# Patient Record
Sex: Female | Born: 1960
Health system: Southern US, Community
[De-identification: ages and names within clinical notes are randomized; demographics above are authoritative.]

## PROBLEM LIST (undated history)

## (undated) DIAGNOSIS — G4730 Sleep apnea, unspecified: Secondary | ICD-10-CM

## (undated) DIAGNOSIS — K5190 Ulcerative colitis, unspecified, without complications: Secondary | ICD-10-CM

## (undated) DIAGNOSIS — T7840XA Allergy, unspecified, initial encounter: Secondary | ICD-10-CM

## (undated) DIAGNOSIS — I10 Essential (primary) hypertension: Secondary | ICD-10-CM

## (undated) DIAGNOSIS — C50919 Malignant neoplasm of unspecified site of unspecified female breast: Secondary | ICD-10-CM

## (undated) DIAGNOSIS — D134 Benign neoplasm of liver: Secondary | ICD-10-CM

## (undated) HISTORY — DX: Ulcerative colitis, unspecified, without complications: K51.90

## (undated) HISTORY — DX: Benign neoplasm of liver: D13.4

## (undated) HISTORY — DX: Malignant neoplasm of unspecified site of unspecified female breast: C50.919

## (undated) HISTORY — DX: Essential (primary) hypertension: I10

## (undated) HISTORY — PX: DIAGNOSTIC LAPAROSCOPY: SUR761

---

## 2006-11-12 ENCOUNTER — Ambulatory Visit (HOSPITAL_COMMUNITY): Admission: RE | Admit: 2006-11-12 | Discharge: 2006-11-12 | Payer: Self-pay | Admitting: Cardiology

## 2006-11-21 ENCOUNTER — Encounter: Admission: RE | Admit: 2006-11-21 | Discharge: 2006-11-21 | Payer: Self-pay | Admitting: Gastroenterology

## 2006-11-30 ENCOUNTER — Ambulatory Visit (HOSPITAL_COMMUNITY): Admission: RE | Admit: 2006-11-30 | Discharge: 2006-11-30 | Payer: Self-pay | Admitting: Gastroenterology

## 2006-11-30 ENCOUNTER — Encounter (INDEPENDENT_AMBULATORY_CARE_PROVIDER_SITE_OTHER): Payer: Self-pay | Admitting: Specialist

## 2008-07-14 ENCOUNTER — Other Ambulatory Visit: Admission: RE | Admit: 2008-07-14 | Discharge: 2008-07-14 | Payer: Self-pay | Admitting: Family Medicine

## 2009-07-14 ENCOUNTER — Other Ambulatory Visit: Admission: RE | Admit: 2009-07-14 | Discharge: 2009-07-14 | Payer: Self-pay | Admitting: Family Medicine

## 2012-01-30 ENCOUNTER — Other Ambulatory Visit: Payer: Self-pay | Admitting: Family Medicine

## 2012-01-30 ENCOUNTER — Other Ambulatory Visit (HOSPITAL_COMMUNITY)
Admission: RE | Admit: 2012-01-30 | Discharge: 2012-01-30 | Disposition: A | Discharge status: Home / Self Care | Payer: Commercial Managed Care - PPO | Source: Ambulatory Visit | Attending: Family Medicine | Admitting: Family Medicine

## 2012-01-30 DIAGNOSIS — Z0000 Encounter for general adult medical examination without abnormal findings: Secondary | ICD-10-CM | POA: Insufficient documentation

## 2012-05-20 ENCOUNTER — Other Ambulatory Visit: Payer: Self-pay | Admitting: Gastroenterology

## 2014-07-08 ENCOUNTER — Other Ambulatory Visit: Payer: Self-pay | Admitting: Family Medicine

## 2014-07-08 DIAGNOSIS — M549 Dorsalgia, unspecified: Secondary | ICD-10-CM

## 2014-07-13 ENCOUNTER — Ambulatory Visit
Admission: RE | Admit: 2014-07-13 | Discharge: 2014-07-13 | Disposition: A | Discharge status: Home / Self Care | Payer: Commercial Managed Care - PPO | Source: Ambulatory Visit | Attending: Family Medicine | Admitting: Family Medicine

## 2014-07-13 DIAGNOSIS — M549 Dorsalgia, unspecified: Secondary | ICD-10-CM

## 2015-02-02 ENCOUNTER — Other Ambulatory Visit (HOSPITAL_COMMUNITY)
Admission: RE | Admit: 2015-02-02 | Discharge: 2015-02-02 | Disposition: A | Discharge status: Home / Self Care | Payer: Commercial Managed Care - PPO | Source: Ambulatory Visit | Attending: Family Medicine | Admitting: Family Medicine

## 2015-02-02 ENCOUNTER — Other Ambulatory Visit: Payer: Self-pay | Admitting: Family Medicine

## 2015-02-02 DIAGNOSIS — Z01419 Encounter for gynecological examination (general) (routine) without abnormal findings: Secondary | ICD-10-CM | POA: Insufficient documentation

## 2015-02-03 LAB — CYTOLOGY - PAP

## 2016-08-24 DIAGNOSIS — I10 Essential (primary) hypertension: Secondary | ICD-10-CM | POA: Diagnosis not present

## 2016-09-22 DIAGNOSIS — K633 Ulcer of intestine: Secondary | ICD-10-CM | POA: Diagnosis not present

## 2016-09-22 DIAGNOSIS — K523 Indeterminate colitis: Secondary | ICD-10-CM | POA: Diagnosis not present

## 2016-11-29 ENCOUNTER — Other Ambulatory Visit: Payer: Self-pay | Admitting: Gastroenterology

## 2016-11-29 DIAGNOSIS — D134 Benign neoplasm of liver: Secondary | ICD-10-CM | POA: Diagnosis not present

## 2016-11-29 DIAGNOSIS — K5190 Ulcerative colitis, unspecified, without complications: Secondary | ICD-10-CM | POA: Diagnosis not present

## 2016-12-16 ENCOUNTER — Ambulatory Visit
Admission: RE | Admit: 2016-12-16 | Discharge: 2016-12-16 | Disposition: A | Discharge status: Home / Self Care | Payer: Commercial Managed Care - PPO | Source: Ambulatory Visit | Attending: Gastroenterology | Admitting: Gastroenterology

## 2016-12-16 DIAGNOSIS — D134 Benign neoplasm of liver: Secondary | ICD-10-CM | POA: Diagnosis not present

## 2016-12-16 MED ORDER — GADOBENATE DIMEGLUMINE 529 MG/ML IV SOLN
18.0000 mL | Freq: Once | INTRAVENOUS | Status: AC | PRN
  Administered 2016-12-16: 18 mL via INTRAVENOUS

## 2017-03-20 DIAGNOSIS — I10 Essential (primary) hypertension: Secondary | ICD-10-CM | POA: Diagnosis not present

## 2017-03-27 DIAGNOSIS — L820 Inflamed seborrheic keratosis: Secondary | ICD-10-CM | POA: Diagnosis not present

## 2017-03-27 DIAGNOSIS — L719 Rosacea, unspecified: Secondary | ICD-10-CM | POA: Diagnosis not present

## 2017-09-13 DIAGNOSIS — I10 Essential (primary) hypertension: Secondary | ICD-10-CM | POA: Diagnosis not present

## 2017-09-13 DIAGNOSIS — Z1231 Encounter for screening mammogram for malignant neoplasm of breast: Secondary | ICD-10-CM | POA: Diagnosis not present

## 2017-09-13 DIAGNOSIS — Z803 Family history of malignant neoplasm of breast: Secondary | ICD-10-CM | POA: Diagnosis not present

## 2017-09-25 DIAGNOSIS — L719 Rosacea, unspecified: Secondary | ICD-10-CM | POA: Diagnosis not present

## 2017-09-25 DIAGNOSIS — L814 Other melanin hyperpigmentation: Secondary | ICD-10-CM | POA: Diagnosis not present

## 2017-09-25 DIAGNOSIS — L820 Inflamed seborrheic keratosis: Secondary | ICD-10-CM | POA: Diagnosis not present

## 2018-03-19 DIAGNOSIS — K5190 Ulcerative colitis, unspecified, without complications: Secondary | ICD-10-CM | POA: Diagnosis not present

## 2018-03-19 DIAGNOSIS — I10 Essential (primary) hypertension: Secondary | ICD-10-CM | POA: Diagnosis not present

## 2018-08-28 DIAGNOSIS — Z6830 Body mass index (BMI) 30.0-30.9, adult: Secondary | ICD-10-CM | POA: Diagnosis not present

## 2018-08-28 DIAGNOSIS — Z13220 Encounter for screening for lipoid disorders: Secondary | ICD-10-CM | POA: Diagnosis not present

## 2018-08-28 DIAGNOSIS — I10 Essential (primary) hypertension: Secondary | ICD-10-CM | POA: Diagnosis not present

## 2018-08-28 DIAGNOSIS — Z0000 Encounter for general adult medical examination without abnormal findings: Secondary | ICD-10-CM | POA: Diagnosis not present

## 2018-09-26 DIAGNOSIS — D485 Neoplasm of uncertain behavior of skin: Secondary | ICD-10-CM | POA: Diagnosis not present

## 2018-09-26 DIAGNOSIS — L219 Seborrheic dermatitis, unspecified: Secondary | ICD-10-CM | POA: Diagnosis not present

## 2018-09-26 DIAGNOSIS — L570 Actinic keratosis: Secondary | ICD-10-CM | POA: Diagnosis not present

## 2018-09-26 DIAGNOSIS — L299 Pruritus, unspecified: Secondary | ICD-10-CM | POA: Diagnosis not present

## 2018-10-07 DIAGNOSIS — Z1231 Encounter for screening mammogram for malignant neoplasm of breast: Secondary | ICD-10-CM | POA: Diagnosis not present

## 2018-10-22 DIAGNOSIS — C44719 Basal cell carcinoma of skin of left lower limb, including hip: Secondary | ICD-10-CM | POA: Diagnosis not present

## 2019-09-16 ENCOUNTER — Other Ambulatory Visit: Payer: Self-pay | Admitting: Family Medicine

## 2019-09-16 ENCOUNTER — Other Ambulatory Visit (HOSPITAL_COMMUNITY)
Admission: RE | Admit: 2019-09-16 | Discharge: 2019-09-16 | Disposition: A | Discharge status: Home / Self Care | Payer: Commercial Managed Care - PPO | Source: Ambulatory Visit | Attending: Family Medicine | Admitting: Family Medicine

## 2019-09-16 DIAGNOSIS — Z124 Encounter for screening for malignant neoplasm of cervix: Secondary | ICD-10-CM | POA: Insufficient documentation

## 2019-09-18 LAB — CYTOLOGY - PAP
Comment: NEGATIVE
Diagnosis: NEGATIVE
High risk HPV: NEGATIVE

## 2020-03-15 DIAGNOSIS — J4520 Mild intermittent asthma, uncomplicated: Secondary | ICD-10-CM | POA: Diagnosis not present

## 2020-03-15 DIAGNOSIS — F439 Reaction to severe stress, unspecified: Secondary | ICD-10-CM | POA: Diagnosis not present

## 2020-03-15 DIAGNOSIS — I10 Essential (primary) hypertension: Secondary | ICD-10-CM | POA: Diagnosis not present

## 2020-03-15 DIAGNOSIS — K5190 Ulcerative colitis, unspecified, without complications: Secondary | ICD-10-CM | POA: Diagnosis not present

## 2020-09-23 DIAGNOSIS — Z0000 Encounter for general adult medical examination without abnormal findings: Secondary | ICD-10-CM | POA: Diagnosis not present

## 2020-09-23 DIAGNOSIS — J4520 Mild intermittent asthma, uncomplicated: Secondary | ICD-10-CM | POA: Diagnosis not present

## 2020-09-23 DIAGNOSIS — Z13220 Encounter for screening for lipoid disorders: Secondary | ICD-10-CM | POA: Diagnosis not present

## 2020-09-23 DIAGNOSIS — K5190 Ulcerative colitis, unspecified, without complications: Secondary | ICD-10-CM | POA: Diagnosis not present

## 2020-09-23 DIAGNOSIS — I10 Essential (primary) hypertension: Secondary | ICD-10-CM | POA: Diagnosis not present

## 2020-09-23 DIAGNOSIS — F439 Reaction to severe stress, unspecified: Secondary | ICD-10-CM | POA: Diagnosis not present

## 2020-10-14 DIAGNOSIS — Z1231 Encounter for screening mammogram for malignant neoplasm of breast: Secondary | ICD-10-CM | POA: Diagnosis not present

## 2020-10-26 DIAGNOSIS — R922 Inconclusive mammogram: Secondary | ICD-10-CM | POA: Diagnosis not present

## 2020-10-26 DIAGNOSIS — R921 Mammographic calcification found on diagnostic imaging of breast: Secondary | ICD-10-CM | POA: Diagnosis not present

## 2020-10-26 DIAGNOSIS — R928 Other abnormal and inconclusive findings on diagnostic imaging of breast: Secondary | ICD-10-CM | POA: Diagnosis not present

## 2020-11-08 DIAGNOSIS — D0512 Intraductal carcinoma in situ of left breast: Secondary | ICD-10-CM | POA: Diagnosis not present

## 2020-11-09 ENCOUNTER — Encounter: Payer: Self-pay | Admitting: Family Medicine

## 2020-11-10 ENCOUNTER — Telehealth: Payer: Self-pay | Admitting: Hematology

## 2020-11-10 NOTE — Telephone Encounter (Signed)
LVM in reference to upcoming Breast Clinic appointment for [phone] on [phone], will email patient appointment information as well

## 2020-11-12 ENCOUNTER — Encounter: Payer: Self-pay | Admitting: *Deleted

## 2020-11-12 ENCOUNTER — Telehealth: Payer: Self-pay | Admitting: Hematology

## 2020-11-12 DIAGNOSIS — D0512 Intraductal carcinoma in situ of left breast: Secondary | ICD-10-CM

## 2020-11-12 NOTE — Telephone Encounter (Signed)
Patient called back to switch board to confirm appointment for 4/20

## 2020-11-17 ENCOUNTER — Encounter: Payer: Self-pay | Admitting: Licensed Clinical Social Worker

## 2020-11-17 ENCOUNTER — Ambulatory Visit
Admission: RE | Admit: 2020-11-17 | Discharge: 2020-11-17 | Disposition: A | Discharge status: Home / Self Care | Payer: BC Managed Care – PPO | Source: Ambulatory Visit | Attending: Radiation Oncology | Admitting: Radiation Oncology

## 2020-11-17 ENCOUNTER — Other Ambulatory Visit: Payer: Self-pay | Admitting: General Surgery

## 2020-11-17 ENCOUNTER — Encounter: Payer: Self-pay | Admitting: Hematology

## 2020-11-17 ENCOUNTER — Other Ambulatory Visit: Payer: Self-pay

## 2020-11-17 ENCOUNTER — Inpatient Hospital Stay: Payer: BC Managed Care – PPO

## 2020-11-17 ENCOUNTER — Inpatient Hospital Stay (HOSPITAL_BASED_OUTPATIENT_CLINIC_OR_DEPARTMENT_OTHER): Payer: BC Managed Care – PPO | Admitting: Genetic Counselor

## 2020-11-17 ENCOUNTER — Encounter: Payer: Self-pay | Admitting: *Deleted

## 2020-11-17 ENCOUNTER — Inpatient Hospital Stay: Payer: BC Managed Care – PPO | Attending: Hematology | Admitting: Hematology

## 2020-11-17 VITALS — BP 132/70 | HR 65 | Temp 97.9°F | Resp 20 | Ht 68.0 in | Wt 208.4 lb

## 2020-11-17 DIAGNOSIS — D0512 Intraductal carcinoma in situ of left breast: Secondary | ICD-10-CM

## 2020-11-17 DIAGNOSIS — Z803 Family history of malignant neoplasm of breast: Secondary | ICD-10-CM | POA: Insufficient documentation

## 2020-11-17 DIAGNOSIS — Z79899 Other long term (current) drug therapy: Secondary | ICD-10-CM | POA: Diagnosis not present

## 2020-11-17 DIAGNOSIS — K5190 Ulcerative colitis, unspecified, without complications: Secondary | ICD-10-CM | POA: Insufficient documentation

## 2020-11-17 DIAGNOSIS — I10 Essential (primary) hypertension: Secondary | ICD-10-CM | POA: Insufficient documentation

## 2020-11-17 LAB — CMP (CANCER CENTER ONLY)
ALT: 43 U/L (ref 0–44)
AST: 28 U/L (ref 15–41)
Albumin: 4.3 g/dL (ref 3.5–5.0)
Alkaline Phosphatase: 75 U/L (ref 38–126)
Anion gap: 9 (ref 5–15)
BUN: 13 mg/dL (ref 6–20)
CO2: 30 mmol/L (ref 22–32)
Calcium: 9.3 mg/dL (ref 8.9–10.3)
Chloride: 104 mmol/L (ref 98–111)
Creatinine: 0.78 mg/dL (ref 0.44–1.00)
GFR, Estimated: >60 mL/min (ref >60)
Glucose, Bld: 109 mg/dL — ABNORMAL HIGH (ref 70–99)
Potassium: 4.1 mmol/L (ref 3.5–5.1)
Sodium: 143 mmol/L (ref 135–145)
Total Bilirubin: 1.4 mg/dL — ABNORMAL HIGH (ref 0.3–1.2)
Total Protein: 7.4 g/dL (ref 6.5–8.1)

## 2020-11-17 LAB — CBC WITH DIFFERENTIAL (CANCER CENTER ONLY)
Abs Immature Granulocytes: 0.01 10*3/uL (ref 0.00–0.07)
Basophils Absolute: 0.1 10*3/uL (ref 0.0–0.1)
Basophils Relative: 1 %
Eosinophils Absolute: 0.3 10*3/uL (ref 0.0–0.5)
Eosinophils Relative: 5 %
HCT: 41.3 % (ref 36.0–46.0)
Hemoglobin: 13.9 g/dL (ref 12.0–15.0)
Immature Granulocytes: 0 %
Lymphocytes Relative: 35 %
Lymphs Abs: 1.8 10*3/uL (ref 0.7–4.0)
MCH: 29.7 pg (ref 26.0–34.0)
MCHC: 33.7 g/dL (ref 30.0–36.0)
MCV: 88.2 fL (ref 80.0–100.0)
Monocytes Absolute: 0.5 10*3/uL (ref 0.1–1.0)
Monocytes Relative: 9 %
Neutro Abs: 2.6 10*3/uL (ref 1.7–7.7)
Neutrophils Relative %: 50 %
Platelet Count: 204 10*3/uL (ref 150–400)
RBC: 4.68 MIL/uL (ref 3.87–5.11)
RDW: 13.2 % (ref 11.5–15.5)
WBC Count: 5.2 10*3/uL (ref 4.0–10.5)
nRBC: 0 % (ref 0.0–0.2)

## 2020-11-17 LAB — GENETIC SCREENING ORDER

## 2020-11-17 NOTE — Progress Notes (Signed)
Lori Palmer   Lori Palmer "Lori Palmer" is a 60 y.o. year old female accompanied by patient and husband, Arnell Sieving. Clinical Social Work was referred by [facility] for Palmer of psychosocial needs.   SDOH (Social Determinants of Health) assessments performed: Yes SDOH Interventions   Flowsheet Row Most Recent Value  SDOH Interventions   Food Insecurity Interventions Intervention Not Indicated  Housing Interventions Intervention Not Indicated  Transportation Interventions Intervention Not Indicated      Distress Screen completed: Yes ONCBCN DISTRESS SCREENING 11/17/2020  Screening Type Initial Screening  Distress experienced in past week (1-10) 2  Emotional problem type Nervousness/Anxiety  Information Concerns Type Lack of info about treatment      Family/Social Information:  . Housing Arrangement: patient lives with husband, Arnell Sieving. Daughter and grandchildren live nearby . Family members/support persons in your life? Family and Friends/Colleagues . Transportation concerns: no  . Employment: Working full time as Glass blower/designer for [employer]. Income source: Employment . Financial concerns: No o Type of concern: None . Food access concerns: no . Services Currently in place:  n/a  Coping/ Adjustment to diagnosis: . Patient understands treatment plan and what happens next? yes . Concerns about diagnosis and/or treatment: I'm not especially worried about anything . Patient reported stressors: Nervousness . Patient enjoys time with family/ friends and going to the beach, her job . Current coping skills/ strengths: Capable of independent living, Communication skills, Scientist, research (life sciences), Motivation for treatment/growth and Supportive family/friends    SUMMARY: Current SDOH Barriers:  . No significant SDOH barriers noted today  Interventions: . Discussed common feeling and emotions when being diagnosed with cancer, and the importance of support during  treatment . Informed patient of the support team roles and support services at [facility] . Provided CSW contact information and encouraged patient to call with any questions or concerns   Follow Up Plan: Patient will contact CSW with any support or resource needs Patient verbalizes understanding of plan: Yes    Christeen Douglas , LCSW

## 2020-11-17 NOTE — Progress Notes (Signed)
Radiation Oncology         [phone] ________________________________  Multidisciplinary Breast Oncology Clinic [facility]) Initial Outpatient Consultation  Name: Lori Palmer MRN: [id number]  Date: 11/17/2020  DOB: June 05, 1961  GU:YQIHK, Hal Hope, MD  Rolm Bookbinder, MD   REFERRING PHYSICIAN: Rolm Bookbinder, MD  DIAGNOSIS: The encounter diagnosis was Ductal carcinoma in situ (DCIS) of left breast.  Stage 0 Left Breast ductal carcinoma in situ , ER 0% / PR 0% /, Grade 3    ICD-10-CM   1. Ductal carcinoma in situ (DCIS) of left breast  D05.12     HISTORY OF PRESENT ILLNESS::Lori Palmer is a 60 y.o. female who is presenting to the office today for evaluation of her newly diagnosed breast cancer. She is accompanied by her husband. She is doing well overall.   She had routine screening mammography on 10/14/2020 that revealed an indeterminate mass in the left breast. She underwent unilateral diagnostic mammography with tomography and left breast ultrasonography at [facility] on 10/26/2020 that revealed a 1.0 x 0.9 x 0.7 cm irregular mass in the left breast that was suspicious for malignancy.  Biopsy  showed: High-grade intraductal carcinoma. Prognostic indicators significant for: estrogen receptor, 0% negative and progesterone receptor, 0% negative.  She has a PMHx of HTN and Ulcerative colitis. She is seen by GI and has repeat colonoscopies. She is on medication for her HTN and uses diet control for her colitis. I reviewed her medication list with her. She notes PGM had breast cancer in her 30s.   Socially she is married with 1 adult child. 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    GYN HISTORY  Menarchal: 10 LMP: 2007, s/p endometrial ablation Contraceptive: from age [age] HRT: No G1P: First at age 32  The patient was referred today for presentation in the multidisciplinary conference.  Radiology studies and pathology slides were presented there for review  and discussion of treatment options.  A consensus was discussed regarding potential next steps.  PREVIOUS RADIATION THERAPY: No  PAST MEDICAL HISTORY:  Past Medical History:  Diagnosis Date  . Breast cancer ([place])   . Family history of breast cancer 11/18/2020  . Hepatic adenoma   . Hypertension   . Ulcerative colitis ([place])   . Ulcerative colitis ([place])     PAST SURGICAL HISTORY:No past surgical history on file.  FAMILY HISTORY:  Family History  Problem Relation Age of Onset  . Breast cancer Paternal Grandmother 16    SOCIAL HISTORY:  Social History   Socioeconomic History  . Marital status: Married    Spouse name: Not on file  . Number of children: 1  . Years of education: Not on file  . Highest education level: Not on file  Occupational History  . Occupation: Glass blower/designer  Tobacco Use  . Smoking status: Never Smoker  . Smokeless tobacco: Never Used  Vaping Use  . Vaping Use: Never used  Substance and Sexual Activity  . Alcohol use: Not Currently  . Drug use: Never  . Sexual activity: Not on file  Other Topics Concern  . Not on file  Social History Narrative  . Not on file   Social Determinants of Health   Financial Resource Strain: Not on file  Food Insecurity: No Food Insecurity  . Worried About Charity fundraiser in the Last Year: Never true  . Ran Out of Food in the Last Year: Never true  Transportation Needs: No Transportation Needs  . Lack of Transportation (  Medical): No  . Lack of Transportation (Non-Medical): No  Physical Activity: Not on file  Stress: Not on file  Social Connections: Not on file    ALLERGIES:  Allergies  Allergen Reactions  . Penicillins Hives  . Other Other (See Comments)  . Codeine Hives    MEDICATIONS:  Current Outpatient Medications  Medication Sig Dispense Refill  . albuterol (PROAIR HFA) 108 (90 Base) MCG/ACT inhaler 1 puff as needed    . carvedilol (COREG) 12.5 MG tablet Take 12.5 mg by mouth 2 (two) times  daily.    . citalopram (CELEXA) 10 MG tablet Take 10 mg by mouth daily.    . fluticasone (FLONASE) 50 MCG/ACT nasal spray 1 spray in each nostril    . losartan-hydrochlorothiazide (HYZAAR) 50-12.5 MG tablet Take 1 tablet by mouth daily.     No current facility-administered medications for this encounter.    REVIEW OF SYSTEMS: A 10+ POINT REVIEW OF SYSTEMS WAS OBTAINED including neurology, dermatology, psychiatry, cardiac, respiratory, lymph, extremities, GI, GU, musculoskeletal, constitutional, reproductive, HEENT. On the provided form, she reports no breast pain. She denies nipple discharge or bleeding and any other symptoms.    PHYSICAL EXAM: Vitals:   11/17/20 0830  BP: 132/70  Pulse: 65  Resp: 20  Temp: 97.9 F (36.6 C)  SpO2: 100%    Lungs are clear to auscultation bilaterally. Heart has regular rate and rhythm. No palpable cervical, supraclavicular, or axillary adenopathy. Abdomen soft, non-tender, normal bowel sounds. Right breast with no palpable mass, nipple discharge, or bleeding.  Left breast with bruising associated with biopsy site.  No palpable mass nipple discharge or bleeding.   KPS = 100  100 - Normal; no complaints; no evidence of disease. 90   - Able to carry on normal activity; minor signs or symptoms of disease. 80   - Normal activity with effort; some signs or symptoms of disease. 32   - Cares for self; unable to carry on normal activity or to do active work. 60   - Requires occasional assistance, but is able to care for most of his personal needs. 50   - Requires considerable assistance and frequent medical care. 46   - Disabled; requires special care and assistance. 66   - Severely disabled; hospital admission is indicated although death not imminent. 61   - Very sick; hospital admission necessary; active supportive treatment necessary. 10   - Moribund; fatal processes progressing rapidly. 0     - Dead  Karnofsky DA, Abelmann [place], Craver LS and  Burchenal [facility] [phone]) The use of the nitrogen mustards in the palliative treatment of carcinoma: with particular reference to bronchogenic carcinoma Cancer 1 634-56  LABORATORY DATA:  Lab Results  Component Value Date   WBC 5.2 11/17/2020   HGB 13.9 11/17/2020   HCT 41.3 11/17/2020   MCV 88.2 11/17/2020   PLT 204 11/17/2020   Lab Results  Component Value Date   NA 143 11/17/2020   K 4.1 11/17/2020   CL 104 11/17/2020   CO2 30 11/17/2020   Lab Results  Component Value Date   ALT 43 11/17/2020   AST 28 11/17/2020   ALKPHOS 75 11/17/2020   BILITOT 1.4 (H) 11/17/2020    PULMONARY FUNCTION TEST:   Recent Review Flowsheet Data   There is no flowsheet data to display.     RADIOGRAPHY: No results found.    IMPRESSION: Intraductal carcinoma of the left breast presenting in the 6 o'clock position of the breast.  The tumor is high-grade and ER/PR negative.  The patient will be a good candidate for breast conservation with radiotherapy to the left breast. We discussed the general course of radiation, potential side effects, and toxicities with radiation and the patient is interested in this approach.  Given the high-grade nature of the DCIS and the patient's age I would not recommend lumpectomy alone in this situation  Discussed at conference this morning was a question of microinvasion but given the low risk for nodal metastasis in this situation sentinel node procedure is not indicated.   PLAN:  1. Lumpectomy 2. Adjuvant radiation therapy 3. No significant role for adjuvant hormonal therapy given the negative ER/PR status.   ------------------------------------------------  Blair Promise, PhD, MD  This document serves as a record of services personally performed by Gery Pray, MD. It was created on his behalf by Clerance Lav, a trained medical scribe. The creation of this record is based on the scribe's personal observations and the provider's statements to them. This  document has been checked and approved by the attending provider.

## 2020-11-17 NOTE — Progress Notes (Signed)
[name]   [phone] Fax:([phone]) [place] Note   Patient Care Team: Carol Ada, MD as PCP - General (Family Medicine) Rockwell Germany, RN as Oncology Nurse Navigator Mauro Kaufmann, RN as Oncology Nurse Navigator Rolm Bookbinder, MD as Consulting Physician (General Surgery) Truitt Merle, MD as Consulting Physician (Hematology) Gery Pray, MD as Consulting Physician (Radiation Oncology)  Date of Service:  11/17/2020   CHIEF COMPLAINTS/PURPOSE OF CONSULTATION:  Newly diagnosed Ductal carcinoma in situ (DCIS) of left breast   Oncology History Overview Note  Cancer Staging Ductal carcinoma in situ (DCIS) of left breast Staging form: Breast, AJCC 8th Edition - Clinical stage from 11/05/2020: Stage 0 (cTis (DCIS), cN0, cM0, G3, ER-, PR-, HER2: Not Assessed) - Signed by Truitt Merle, MD on 11/17/2020 Stage prefix: Initial diagnosis Histologic grading system: 3 grade system    Ductal carcinoma in situ (DCIS) of left breast  10/26/2020 Breast US   Impression  The 1x0.9x0.7cm mass in the left breast at 6:00 position middle depth, 5cmfnin the left breast is suspicious for malignancy. Biopsy recommended.     11/05/2020 Cancer Staging   Staging form: Breast, AJCC 8th Edition - Clinical stage from 11/05/2020: Stage 0 (cTis (DCIS), cN0, cM0, G3, ER-, PR-, HER2: Not Assessed) - Signed by Truitt Merle, MD on 11/17/2020 Stage prefix: Initial diagnosis Histologic grading system: 3 grade system   11/08/2020 Initial Biopsy   Diagnosis:  Bresat, left, needle core biopsy, left bresat - 1cm mass at 6:00 position middle depth 3cmfn  -DUCTAL CARCINOMA INSITU, HIGH GRADE, MICROINVASION CANNOT BE EXCLUDED  -SEE COMMENT   ER NEGATIVE PR NEGATIVE    11/12/2020 Initial Diagnosis   Ductal carcinoma in situ (DCIS) of left breast      HISTORY OF PRESENTING ILLNESS:  Lori Palmer 60 y.o. female is a here because of newly diagnosed of left breast  cancer. The patient presents to the clinic today accompanied by her husband.  Her left breast mass was found by screening mammogram. She did not feel her mass herself. She notes she gets yearly mammograms and this is first abnormal mammogram. She denies any breast, nipple, weight or appetite change. She overall has no head, breathing, chest, or abdominal issues. She is overall at baseline.   She has a PMHx of HTN and Ulcerative colitis. She is seen by GI and has repeat colonoscopies. She is on medication for her HTN and uses diet control for her colitis. I reviewed her medication list with her. She notes PGM had breast cancer in her 66s.   Socially she is married with 1 adult child. 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     GYN HISTORY  Menarchal: 10 LMP: 2007, s/p endometrial ablation Contraceptive: from age [age] HRT: No G1P: First at age 23   REVIEW OF SYSTEMS:    Constitutional: Denies fevers, chills or abnormal night sweats Eyes: Denies blurriness of vision, double vision or watery eyes Ears, nose, mouth, throat, and face: Denies mucositis or sore throat Respiratory: Denies cough, dyspnea or wheezes Cardiovascular: Denies palpitation, chest discomfort or lower extremity swelling Gastrointestinal:  Denies nausea, heartburn or change in bowel habits Skin: Denies abnormal skin rashes Lymphatics: Denies new lymphadenopathy or easy bruising Neurological:Denies numbness, tingling or new weaknesses Behavioral/Psych: Mood is stable, no new changes  All other systems were reviewed with the patient and are negative.   MEDICAL HISTORY:  Past Medical History:  Diagnosis Date  . Breast cancer (  [place])   . Hepatic adenoma   . Hypertension   . Ulcerative colitis ([place])   . Ulcerative colitis ([place])     SURGICAL HISTORY: History reviewed. No pertinent surgical history.  SOCIAL HISTORY: Social History   Socioeconomic History  . Marital status: Married    Spouse name: Not on  file  . Number of children: 1  . Years of education: Not on file  . Highest education level: Not on file  Occupational History  . Occupation: Glass blower/designer  Tobacco Use  . Smoking status: Never Smoker  . Smokeless tobacco: Never Used  Vaping Use  . Vaping Use: Never used  Substance and Sexual Activity  . Alcohol use: Not Currently  . Drug use: Never  . Sexual activity: Not on file  Other Topics Concern  . Not on file  Social History Narrative  . Not on file   Social Determinants of Health   Financial Resource Strain: Not on file  Food Insecurity: Not on file  Transportation Needs: Not on file  Physical Activity: Not on file  Stress: Not on file  Social Connections: Not on file  Intimate Partner Violence: Not on file    FAMILY HISTORY: Family History  Problem Relation Age of Onset  . Breast cancer Paternal Grandmother 53    ALLERGIES:  is allergic to penicillins, other, and codeine.  MEDICATIONS:  Current Outpatient Medications  Medication Sig Dispense Refill  . fluticasone (FLONASE) 50 MCG/ACT nasal spray 1 spray in each nostril    . albuterol (PROAIR HFA) 108 (90 Base) MCG/ACT inhaler 1 puff as needed    . carvedilol (COREG) 12.5 MG tablet Take 12.5 mg by mouth 2 (two) times daily.    . citalopram (CELEXA) 10 MG tablet Take 10 mg by mouth daily.    Marland Kitchen losartan-hydrochlorothiazide (HYZAAR) 50-12.5 MG tablet Take 1 tablet by mouth daily.     No current facility-administered medications for this visit.    PHYSICAL EXAMINATION: ECOG PERFORMANCE STATUS: 0 - Asymptomatic  Vitals:   11/17/20 0830  BP: 132/70  Pulse: 65  Resp: 20  Temp: 97.9 F (36.6 C)  SpO2: 100%   Filed Weights   11/17/20 0830  Weight: 208 lb 6.4 oz (94.5 kg)    GENERAL:alert, no distress and comfortable SKIN: skin color, texture, turgor are normal, no rashes or significant lesions EYES: normal, Conjunctiva are pink and non-injected, sclera clear  NECK: supple, thyroid normal size,  non-tender, without nodularity LYMPH:  no palpable lymphadenopathy in the cervical, axillary LUNGS: clear to auscultation and percussion with normal breathing effort HEART: regular rate & rhythm and no murmurs and no lower extremity edema ABDOMEN:abdomen soft, non-tender and normal bowel sounds Musculoskeletal:no cyanosis of digits and no clubbing  NEURO: alert & oriented x 3 with fluent speech, no focal motor/sensory deficits BREAST: (+) Skin ecchymosis of left breast at biopsy site (+) Lumpy breast tissue at left breast biopsy site, likely from bleeding. Right Breast exam benign.  LABORATORY DATA:  I have reviewed the data as listed CBC Latest Ref Rng & Units 11/17/2020  WBC 4.0 - 10.5 K/uL 5.2  Hemoglobin 12.0 - 15.0 g/dL 13.9  Hematocrit 36.0 - 46.0 % 41.3  Platelets 150 - 400 K/uL 204    CMP Latest Ref Rng & Units 11/17/2020  Glucose 70 - 99 mg/dL 109(H)  BUN 6 - 20 mg/dL 13  Creatinine 0.44 - 1.00 mg/dL 0.78  Sodium 135 - 145 mmol/L 143  Potassium 3.5 - 5.1 mmol/L 4.1  Chloride 98 - 111 mmol/L 104  CO2 22 - 32 mmol/L 30  Calcium 8.9 - 10.3 mg/dL 9.3  Total Protein 6.5 - 8.1 g/dL 7.4  Total Bilirubin 0.3 - 1.2 mg/dL 1.4(H)  Alkaline Phos 38 - 126 U/L 75  AST 15 - 41 U/L 28  ALT 0 - 44 U/L 43     RADIOGRAPHIC STUDIES: I have personally reviewed the radiological images as listed and agreed with the findings in the report. No results found.  ASSESSMENT & PLAN:  Lori Palmer is a 60 y.o. Caucasian female with a history of HTN and ulcerative colitis   1. Left breast DCIS, grade III, ER-/PR- -I discussed her breast imaging and needle biopsy results with patient and her family members in great detail. Her left breast mass was found by screening mammogram with 1cm mass. Biopsy showed this to be DCIS with concern for microinvasion.  -She is a candidate for breast conservation surgery. She has been seen by breast surgeon Dr. Donne Hazel, who recommends lumpectomy. -Her DCIS  will be cured by complete surgical resection. Any form of adjuvant therapy is preventive. -Given her negative ER and PR markers, I do not recommend antiestrogen therapy  -She will likely benefit from breast radiation if she undergo lumpectomy to decrease the risk of future breast cancer. She will discuss this further with Dr Sondra Come today.  -We also discussed that biopsy may have sampling limitation, we will review her surgical path, to see if she has any invasive carcinoma components. -We discussed breast cancer surveillance after she completes treatment, Including annual mammogram, breast exam every 6-12 months. Given her high risk for future breast cancer, I recommend annual screening breast MRI also -Her breast exam today was overall benign. Her CBC and CMP from today is WNL except BG 109 and Tbili 1.4.  -She will proceed with surgery and f/u with me as needed in the future.    2. Genetics  -I discussed with only one member of family history of breast cancer, she is not eligible for genetic testing to be covered by insurance. If she wants to proceed with testing, she will have to pay out of pocket. Given her daughter's interested, she wants to proceed with testing. She will be referred to genetics    PLAN:  -Proceed with surgery  -F/u with me as needed in the future. Will see her back if her surgical path shows invasive cancer     No orders of the defined types were placed in this encounter.   All questions were answered. The patient knows to call the clinic with any problems, questions or concerns. The total time spent in the appointment was 40 minutes.     Truitt Merle, MD 11/17/2020 10:46 AM  I, Joslyn Devon, am acting as scribe for Truitt Merle, MD.   I have reviewed the above documentation for accuracy and completeness, and I agree with the above.

## 2020-11-18 ENCOUNTER — Encounter: Payer: Self-pay | Admitting: Genetic Counselor

## 2020-11-18 DIAGNOSIS — Z803 Family history of malignant neoplasm of breast: Secondary | ICD-10-CM | POA: Insufficient documentation

## 2020-11-18 HISTORY — DX: Family history of malignant neoplasm of breast: Z80.3

## 2020-11-18 NOTE — Progress Notes (Signed)
REFERRING PROVIDER: Truitt Merle, MD [place],  [place] [address]  PRIMARY PROVIDER:  Carol Ada, MD  PRIMARY REASON FOR VISIT:  1. Ductal carcinoma in situ (DCIS) of left breast   2. Family history of breast cancer     HISTORY OF PRESENT ILLNESS:   Lori Palmer, a 59 y.o. female, was seen for a [facility] cancer genetics consultation at the request of Dr. Burr Medico during the breast multidisciplinary clinic due to a personal and family history of cancer.  Lori Palmer presents to clinic today to discuss the possibility of a hereditary predisposition to cancer, to discuss genetic testing, and to further clarify her future cancer risks, as well as potential cancer risks for family members.   In April 2022, at the age of 47, Lori Palmer was diagnosed with ductal carcinoma in situ of the left breast. The preliminary treatment plan includes surgery and adjuvant radiation.   CANCER HISTORY:  Oncology History Overview Note  Cancer Staging Ductal carcinoma in situ (DCIS) of left breast Staging form: Breast, AJCC 8th Edition - Clinical stage from 11/05/2020: Stage 0 (cTis (DCIS), cN0, cM0, G3, ER-, PR-, HER2: Not Assessed) - Signed by Truitt Merle, MD on 11/17/2020 Stage prefix: Initial diagnosis Histologic grading system: 3 grade system    Ductal carcinoma in situ (DCIS) of left breast  10/26/2020 Breast US   Impression  The 1x0.9x0.7cm mass in the left breast at 6:00 position middle depth, 5cmfnin the left breast is suspicious for malignancy. Biopsy recommended.     11/05/2020 Cancer Staging   Staging form: Breast, AJCC 8th Edition - Clinical stage from 11/05/2020: Stage 0 (cTis (DCIS), cN0, cM0, G3, ER-, PR-, HER2: Not Assessed) - Signed by Truitt Merle, MD on 11/17/2020 Stage prefix: Initial diagnosis Histologic grading system: 3 grade system   11/08/2020 Initial Biopsy   Diagnosis:  Bresat, left, needle core biopsy, left bresat - 1cm mass at 6:00 position middle depth 3cmfn   -DUCTAL CARCINOMA INSITU, HIGH GRADE, MICROINVASION CANNOT BE EXCLUDED  -SEE COMMENT   ER NEGATIVE PR NEGATIVE    11/12/2020 Initial Diagnosis   Ductal carcinoma in situ (DCIS) of left breast     RISK FACTORS:  Menarche was at age 26.  First live birth at age 89.  OCP use for approximately 22 years.  Ovaries intact: yes.  Hysterectomy: no.  Menopausal status: postmenopausal.  HRT use: 0 years. Colonoscopy: yes; most recent in May 2021. Mammogram within the last year: yes. Up to date with pelvic exams: yes; most recent PAP in 2021  Past Medical History:  Diagnosis Date  . Breast cancer ([place])   . Family history of breast cancer 11/18/2020  . Hepatic adenoma   . Hypertension   . Ulcerative colitis ([place])   . Ulcerative colitis ([place])     No past surgical history on file.  Social History   Socioeconomic History  . Marital status: Married    Spouse name: Not on file  . Number of children: 1  . Years of education: Not on file  . Highest education level: Not on file  Occupational History  . Occupation: Glass blower/designer  Tobacco Use  . Smoking status: Never Smoker  . Smokeless tobacco: Never Used  Vaping Use  . Vaping Use: Never used  Substance and Sexual Activity  . Alcohol use: Not Currently  . Drug use: Never  . Sexual activity: Not on file  Other Topics Concern  . Not on file  Social History Narrative  .  Not on file   Social Determinants of Health   Financial Resource Strain: Not on file  Food Insecurity: No Food Insecurity  . Worried About Charity fundraiser in the Last Year: Never true  . Ran Out of Food in the Last Year: Never true  Transportation Needs: No Transportation Needs  . Lack of Transportation (Medical): No  . Lack of Transportation (Non-Medical): No  Physical Activity: Not on file  Stress: Not on file  Social Connections: Not on file     FAMILY HISTORY:  We obtained a detailed, 4-generation family history.  Significant diagnoses are  listed below: Family History  Problem Relation Age of Onset  . Breast cancer Paternal Grandmother 86    Lori Palmer has one daughter, Lori Palmer, who is 76 years old.  She has one brother, age 33, and one sister, age 24.  Lori Palmer mother passed away at age 72 and did not have cancer.  Lori Palmer father passed away at age 61 and did not have cancer.  Lori Palmer paternal grandmother was diagnosed with breast cancer at age 58s.    Lori Palmer is is no of previous family history of genetic testing for hereditary cancer risks. There is no reported Ashkenazi Jewish ancestry. There is no known consanguinity.  GENETIC COUNSELING ASSESSMENT: Lori Palmer is a 60 y.o. female with a personal and family history of cancer which is not suggestive of a hereditary cancer syndrome at this time. We, therefore, discussed and recommended the following at today's visit.   DISCUSSION: We discussed that, in general, most cancer is not inherited in families, but instead is sporadic or familial. Sporadic cancers occur by chance and typically happen at older ages (>50 years) as this type of cancer is caused by genetic changes acquired during an individual's lifetime. Some families have more cancers than would be expected by chance; however, the ages or types of cancer are not consistent with a known genetic mutation or known genetic mutations have been ruled out. This type of familial cancer is thought to be due to a combination of multiple genetic, environmental, hormonal, and lifestyle factors. While this combination of factors likely increases the risk of cancer, the exact source of this risk is not currently identifiable or testable.    We discussed that approximately 5-10% of cancer is hereditary, meaning that it is due to a mutation in a single gene that is passed down from generation to generation in a family. Most hereditary cases of breast cancer are associated with mutations in BRCA1/2. There are other  genes that can be associated an increased risk for breast cancer. We discussed that testing can be beneficial for several reasons, including knowing about other cancer risks, identifying potential screening and risk-reduction options that may be appropriate, and to understand if other family members could be at risk for cancer and allow them to undergo genetic testing.   We discussed with Lori Palmer that the personal and family history does not meet insurance or NCCN criteria for genetic testing and, therefore, is not highly consistent with a familial hereditary cancer syndrome.  We feel she is at low risk to harbor a gene mutation associated with such a condition. Thus, we did not recommend any genetic testing, at this time, and recommended Lori Palmer continue to follow the cancer screening guidelines given by her primary healthcare provider.  Lori Palmer was made aware that testing is available if she wishes to proceed with genetic testing through a self-pay price of $  100.     PLAN:  Lori Palmer did not wish to pursue genetic testing at today's visit.  She stated that her daughter, who is more interested in genetic testing, may coordinate testing through her OB/GYN. We understand this decision and remain available to coordinate genetic testing at any time as needed in the future. We, therefore, recommend Lori Palmer continue to follow the cancer screening guidelines given by her primary healthcare and oncology providers.  Lastly, we encouraged Lori Palmer to remain in contact with cancer genetics annually so that we can continuously update the family history and inform her of any changes in cancer genetics and testing that may be of benefit for this family.   Lori Palmer questions were answered to her satisfaction today. Our contact information was provided should additional questions or concerns arise. Thank you for the referral and allowing [place] to share in the care of your patient.   Lori Palmer M.  Joette Catching, [place], [facility] Genetic Counselor [email or web address] (P) [phone]  The patient was seen for a total of 20 minutes in face-to-face genetic counseling.  Drs. Magrinat, Lindi Adie and/or Burr Medico were available to discuss this case as needed.    _______________________________________________________________________ For Office Staff:  Number of people involved in session: 1 Was an Intern/ student involved with case: no

## 2020-11-19 ENCOUNTER — Ambulatory Visit
Admission: RE | Admit: 2020-11-19 | Discharge: 2020-11-19 | Disposition: A | Discharge status: Home / Self Care | Payer: Self-pay | Source: Ambulatory Visit | Attending: Radiation Oncology | Admitting: Radiation Oncology

## 2020-11-19 ENCOUNTER — Other Ambulatory Visit: Payer: Self-pay | Admitting: Radiation Oncology

## 2020-11-19 ENCOUNTER — Encounter: Payer: Self-pay | Admitting: *Deleted

## 2020-11-19 ENCOUNTER — Inpatient Hospital Stay
Admission: RE | Admit: 2020-11-19 | Discharge: 2020-11-19 | Disposition: A | Discharge status: Home / Self Care | Payer: Self-pay | Source: Ambulatory Visit | Attending: Radiation Oncology | Admitting: Radiation Oncology

## 2020-11-19 DIAGNOSIS — C50919 Malignant neoplasm of unspecified site of unspecified female breast: Secondary | ICD-10-CM

## 2020-11-19 DIAGNOSIS — D0512 Intraductal carcinoma in situ of left breast: Secondary | ICD-10-CM

## 2020-11-22 ENCOUNTER — Telehealth: Payer: Self-pay | Admitting: *Deleted

## 2020-11-22 ENCOUNTER — Encounter: Payer: Self-pay | Admitting: *Deleted

## 2020-11-22 NOTE — Telephone Encounter (Signed)
Spoke to pt concerning [facility] from 4.20.22. Denies questions or concerns regarding dx or treatment care plan. Encourage pt to call with needs. Received verbal understanding. 

## 2020-11-26 ENCOUNTER — Other Ambulatory Visit: Payer: Self-pay

## 2020-11-26 ENCOUNTER — Encounter (HOSPITAL_BASED_OUTPATIENT_CLINIC_OR_DEPARTMENT_OTHER): Payer: Self-pay | Admitting: General Surgery

## 2020-11-26 NOTE — Progress Notes (Signed)
Spoke with patient for PAT phone call. She mentioned that at her biopsy appointment she had some abnormal bleeding afterwards where the nurses had to hold extra pressure, but was discharged home safely. Patient was instructed to not take any supplements, NSAIDs, or other blood thinning medications before surgery. Left message with a nurse at [facility] to notify Dr. Donne Hazel.

## 2020-11-29 ENCOUNTER — Other Ambulatory Visit (HOSPITAL_COMMUNITY)
Admission: RE | Admit: 2020-11-29 | Discharge: 2020-11-29 | Disposition: A | Discharge status: Home / Self Care | Payer: BC Managed Care – PPO | Source: Ambulatory Visit | Attending: General Surgery | Admitting: General Surgery

## 2020-11-29 DIAGNOSIS — Z20822 Contact with and (suspected) exposure to covid-19: Secondary | ICD-10-CM | POA: Insufficient documentation

## 2020-11-29 DIAGNOSIS — D0512 Intraductal carcinoma in situ of left breast: Secondary | ICD-10-CM | POA: Diagnosis not present

## 2020-11-29 DIAGNOSIS — N6012 Diffuse cystic mastopathy of left breast: Secondary | ICD-10-CM | POA: Diagnosis not present

## 2020-11-29 DIAGNOSIS — Z01812 Encounter for preprocedural laboratory examination: Secondary | ICD-10-CM | POA: Insufficient documentation

## 2020-11-29 DIAGNOSIS — D242 Benign neoplasm of left breast: Secondary | ICD-10-CM | POA: Diagnosis not present

## 2020-11-30 ENCOUNTER — Other Ambulatory Visit: Payer: Self-pay | Admitting: *Deleted

## 2020-11-30 LAB — SARS CORONAVIRUS 2 (TAT 6-24 HRS): SARS Coronavirus 2: NEGATIVE

## 2020-12-01 ENCOUNTER — Encounter (HOSPITAL_BASED_OUTPATIENT_CLINIC_OR_DEPARTMENT_OTHER)
Admission: RE | Admit: 2020-12-01 | Discharge: 2020-12-01 | Disposition: A | Discharge status: Home / Self Care | Payer: BC Managed Care – PPO | Source: Ambulatory Visit | Attending: General Surgery | Admitting: General Surgery

## 2020-12-01 DIAGNOSIS — Z20822 Contact with and (suspected) exposure to covid-19: Secondary | ICD-10-CM | POA: Diagnosis not present

## 2020-12-01 DIAGNOSIS — D242 Benign neoplasm of left breast: Secondary | ICD-10-CM | POA: Diagnosis not present

## 2020-12-01 DIAGNOSIS — Z01818 Encounter for other preprocedural examination: Secondary | ICD-10-CM | POA: Insufficient documentation

## 2020-12-01 DIAGNOSIS — N6012 Diffuse cystic mastopathy of left breast: Secondary | ICD-10-CM | POA: Diagnosis not present

## 2020-12-01 DIAGNOSIS — D0512 Intraductal carcinoma in situ of left breast: Secondary | ICD-10-CM | POA: Diagnosis not present

## 2020-12-01 LAB — BASIC METABOLIC PANEL
Anion gap: 7 (ref 5–15)
BUN: 15 mg/dL (ref 6–20)
CO2: 30 mmol/L (ref 22–32)
Calcium: 9.4 mg/dL (ref 8.9–10.3)
Chloride: 101 mmol/L (ref 98–111)
Creatinine, Ser: 0.76 mg/dL (ref 0.44–1.00)
GFR, Estimated: >60 mL/min (ref >60)
Glucose, Bld: 101 mg/dL — ABNORMAL HIGH (ref 70–99)
Potassium: 4.3 mmol/L (ref 3.5–5.1)
Sodium: 138 mmol/L (ref 135–145)

## 2020-12-01 MED ORDER — ENSURE PRE-SURGERY PO LIQD: 296.0000 mL | Freq: Once | ORAL | Status: DC

## 2020-12-01 NOTE — Progress Notes (Signed)
Sent message reminding patient to come in for lab work today.

## 2020-12-01 NOTE — Progress Notes (Signed)

## 2020-12-02 ENCOUNTER — Ambulatory Visit (HOSPITAL_BASED_OUTPATIENT_CLINIC_OR_DEPARTMENT_OTHER)
Admission: RE | Admit: 2020-12-02 | Discharge: 2020-12-02 | Disposition: A | Discharge status: Home / Self Care | Payer: BC Managed Care – PPO | Attending: General Surgery | Admitting: General Surgery

## 2020-12-02 ENCOUNTER — Encounter (HOSPITAL_BASED_OUTPATIENT_CLINIC_OR_DEPARTMENT_OTHER): Payer: Self-pay | Admitting: General Surgery

## 2020-12-02 ENCOUNTER — Ambulatory Visit (HOSPITAL_BASED_OUTPATIENT_CLINIC_OR_DEPARTMENT_OTHER): Payer: BC Managed Care – PPO | Admitting: Anesthesiology

## 2020-12-02 ENCOUNTER — Encounter (HOSPITAL_BASED_OUTPATIENT_CLINIC_OR_DEPARTMENT_OTHER)
Admission: RE | Disposition: A | Discharge status: Home / Self Care | Payer: Self-pay | Source: Home / Self Care | Attending: General Surgery

## 2020-12-02 ENCOUNTER — Other Ambulatory Visit: Payer: Self-pay

## 2020-12-02 DIAGNOSIS — D242 Benign neoplasm of left breast: Secondary | ICD-10-CM | POA: Insufficient documentation

## 2020-12-02 DIAGNOSIS — D0512 Intraductal carcinoma in situ of left breast: Secondary | ICD-10-CM | POA: Diagnosis not present

## 2020-12-02 DIAGNOSIS — Z20822 Contact with and (suspected) exposure to covid-19: Secondary | ICD-10-CM | POA: Insufficient documentation

## 2020-12-02 DIAGNOSIS — C50912 Malignant neoplasm of unspecified site of left female breast: Secondary | ICD-10-CM | POA: Diagnosis not present

## 2020-12-02 DIAGNOSIS — K5190 Ulcerative colitis, unspecified, without complications: Secondary | ICD-10-CM | POA: Diagnosis not present

## 2020-12-02 DIAGNOSIS — N6012 Diffuse cystic mastopathy of left breast: Secondary | ICD-10-CM | POA: Insufficient documentation

## 2020-12-02 DIAGNOSIS — N6092 Unspecified benign mammary dysplasia of left breast: Secondary | ICD-10-CM | POA: Diagnosis not present

## 2020-12-02 DIAGNOSIS — G4730 Sleep apnea, unspecified: Secondary | ICD-10-CM | POA: Diagnosis not present

## 2020-12-02 DIAGNOSIS — I10 Essential (primary) hypertension: Secondary | ICD-10-CM | POA: Diagnosis not present

## 2020-12-02 HISTORY — DX: Sleep apnea, unspecified: G47.30

## 2020-12-02 HISTORY — PX: BREAST LUMPECTOMY WITH RADIOACTIVE SEED LOCALIZATION: SHX6424

## 2020-12-02 HISTORY — DX: Allergy, unspecified, initial encounter: T78.40XA

## 2020-12-02 SURGERY — BREAST LUMPECTOMY WITH RADIOACTIVE SEED LOCALIZATION
Anesthesia: General | Site: Breast | Laterality: Left

## 2020-12-02 MED ORDER — EPHEDRINE 5 MG/ML INJ
INTRAVENOUS | Status: AC
  Filled 2020-12-02: qty 10

## 2020-12-02 MED ORDER — DEXAMETHASONE SODIUM PHOSPHATE 10 MG/ML IJ SOLN
INTRAMUSCULAR | Status: AC
  Filled 2020-12-02: qty 1

## 2020-12-02 MED ORDER — OXYCODONE HCL 5 MG/5ML PO SOLN
5.0000 mg | Freq: Once | ORAL | Status: DC | PRN
Start: 2020-12-02 — End: 2020-12-02

## 2020-12-02 MED ORDER — MIDAZOLAM HCL 5 MG/5ML IJ SOLN
INTRAMUSCULAR | Status: DC | PRN
  Administered 2020-12-02: 2 mg via INTRAVENOUS

## 2020-12-02 MED ORDER — PROPOFOL 10 MG/ML IV BOLUS
INTRAVENOUS | Status: DC | PRN
  Administered 2020-12-02: 200 mg via INTRAVENOUS

## 2020-12-02 MED ORDER — PROMETHAZINE HCL 25 MG/ML IJ SOLN: 6.2500 mg | INTRAMUSCULAR | Status: DC | PRN

## 2020-12-02 MED ORDER — HYDROMORPHONE HCL 1 MG/ML IJ SOLN: 0.2500 mg | INTRAMUSCULAR | Status: DC | PRN

## 2020-12-02 MED ORDER — AMISULPRIDE (ANTIEMETIC) 5 MG/2ML IV SOLN: 10.0000 mg | Freq: Once | INTRAVENOUS | Status: DC | PRN

## 2020-12-02 MED ORDER — BUPIVACAINE HCL (PF) 0.25 % IJ SOLN
INTRAMUSCULAR | Status: DC | PRN
  Administered 2020-12-02: 10 mL

## 2020-12-02 MED ORDER — ONDANSETRON HCL 4 MG/2ML IJ SOLN
INTRAMUSCULAR | Status: AC
  Filled 2020-12-02: qty 2

## 2020-12-02 MED ORDER — KETOROLAC TROMETHAMINE 15 MG/ML IJ SOLN
15.0000 mg | INTRAMUSCULAR | Status: AC
  Administered 2020-12-02: 15 mg via INTRAVENOUS

## 2020-12-02 MED ORDER — MIDAZOLAM HCL 2 MG/2ML IJ SOLN
INTRAMUSCULAR | Status: AC
  Filled 2020-12-02: qty 2

## 2020-12-02 MED ORDER — EPHEDRINE SULFATE 50 MG/ML IJ SOLN
INTRAMUSCULAR | Status: DC | PRN
  Administered 2020-12-02: 20 mg via INTRAVENOUS

## 2020-12-02 MED ORDER — FENTANYL CITRATE (PF) 100 MCG/2ML IJ SOLN
INTRAMUSCULAR | Status: AC
  Filled 2020-12-02: qty 2

## 2020-12-02 MED ORDER — LIDOCAINE HCL (CARDIAC) PF 100 MG/5ML IV SOSY
PREFILLED_SYRINGE | INTRAVENOUS | Status: DC | PRN
  Administered 2020-12-02: 60 mg via INTRAVENOUS

## 2020-12-02 MED ORDER — PHENYLEPHRINE 40 MCG/ML (10ML) SYRINGE FOR IV PUSH (FOR BLOOD PRESSURE SUPPORT)
PREFILLED_SYRINGE | INTRAVENOUS | Status: AC
  Filled 2020-12-02: qty 10

## 2020-12-02 MED ORDER — LACTATED RINGERS IV SOLN: INTRAVENOUS | Status: DC

## 2020-12-02 MED ORDER — FENTANYL CITRATE (PF) 100 MCG/2ML IJ SOLN
INTRAMUSCULAR | Status: DC | PRN
  Administered 2020-12-02: 100 ug via INTRAVENOUS

## 2020-12-02 MED ORDER — TRAMADOL HCL 50 MG PO TABS: 50.0000 mg | ORAL_TABLET | Freq: Four times a day (QID) | ORAL | 0 refills | Status: AC | PRN

## 2020-12-02 MED ORDER — KETOROLAC TROMETHAMINE 15 MG/ML IJ SOLN
INTRAMUSCULAR | Status: AC
  Filled 2020-12-02: qty 1

## 2020-12-02 MED ORDER — DEXAMETHASONE SODIUM PHOSPHATE 4 MG/ML IJ SOLN
INTRAMUSCULAR | Status: DC | PRN
  Administered 2020-12-02: 10 mg via INTRAVENOUS

## 2020-12-02 MED ORDER — MEPERIDINE HCL 25 MG/ML IJ SOLN: 6.2500 mg | INTRAMUSCULAR | Status: DC | PRN

## 2020-12-02 MED ORDER — ONDANSETRON HCL 4 MG/2ML IJ SOLN
INTRAMUSCULAR | Status: DC | PRN
  Administered 2020-12-02: 4 mg via INTRAVENOUS

## 2020-12-02 MED ORDER — PROPOFOL 500 MG/50ML IV EMUL
INTRAVENOUS | Status: AC
  Filled 2020-12-02: qty 50

## 2020-12-02 MED ORDER — OXYCODONE HCL 5 MG PO TABS: 5.0000 mg | ORAL_TABLET | Freq: Once | ORAL | Status: DC | PRN

## 2020-12-02 MED ORDER — CEFAZOLIN SODIUM-DEXTROSE 2-4 GM/100ML-% IV SOLN
2.0000 g | INTRAVENOUS | Status: AC
  Administered 2020-12-02: 2 g via INTRAVENOUS

## 2020-12-02 MED ORDER — CEFAZOLIN SODIUM-DEXTROSE 2-4 GM/100ML-% IV SOLN
INTRAVENOUS | Status: AC
  Filled 2020-12-02: qty 100

## 2020-12-02 MED ORDER — ACETAMINOPHEN 500 MG PO TABS
1000.0000 mg | ORAL_TABLET | ORAL | Status: AC
  Administered 2020-12-02: 1000 mg via ORAL

## 2020-12-02 MED ORDER — ACETAMINOPHEN 500 MG PO TABS
ORAL_TABLET | ORAL | Status: AC
  Filled 2020-12-02: qty 2

## 2020-12-02 MED ORDER — LIDOCAINE 2% (20 MG/ML) 5 ML SYRINGE
INTRAMUSCULAR | Status: AC
  Filled 2020-12-02: qty 5

## 2020-12-02 SURGICAL SUPPLY — 57 items
ADH SKN CLS APL DERMABOND .7 (GAUZE/BANDAGES/DRESSINGS) ×1
APL PRP STRL LF DISP 70% ISPRP (MISCELLANEOUS) ×1
APPLIER CLIP 9.375 MED OPEN (MISCELLANEOUS)
APR CLP MED 9.3 20 MLT OPN (MISCELLANEOUS)
BINDER BREAST LRG (GAUZE/BANDAGES/DRESSINGS) IMPLANT
BINDER BREAST MEDIUM (GAUZE/BANDAGES/DRESSINGS) IMPLANT
BINDER BREAST XLRG (GAUZE/BANDAGES/DRESSINGS) ×1 IMPLANT
BINDER BREAST XXLRG (GAUZE/BANDAGES/DRESSINGS) IMPLANT
BLADE SURG 15 STRL LF DISP TIS (BLADE) ×1 IMPLANT
BLADE SURG 15 STRL SS (BLADE) ×2
CANISTER SUC SOCK COL 7IN (MISCELLANEOUS) IMPLANT
CANISTER SUCT 1200ML W/VALVE (MISCELLANEOUS) ×1 IMPLANT
CHLORAPREP W/TINT 26 (MISCELLANEOUS) ×2 IMPLANT
CLIP APPLIE 9.375 MED OPEN (MISCELLANEOUS) IMPLANT
CLIP VESOCCLUDE SM WIDE 6/CT (CLIP) IMPLANT
COVER BACK TABLE 60X90IN (DRAPES) ×2 IMPLANT
COVER MAYO STAND STRL (DRAPES) ×2 IMPLANT
COVER PROBE W GEL 5X96 (DRAPES) ×2 IMPLANT
COVER WAND RF STERILE (DRAPES) IMPLANT
DECANTER SPIKE VIAL GLASS SM (MISCELLANEOUS) ×1 IMPLANT
DERMABOND ADVANCED (GAUZE/BANDAGES/DRESSINGS) ×1
DERMABOND ADVANCED .7 DNX12 (GAUZE/BANDAGES/DRESSINGS) ×1 IMPLANT
DRAPE LAPAROSCOPIC ABDOMINAL (DRAPES) ×2 IMPLANT
DRAPE UTILITY XL STRL (DRAPES) ×2 IMPLANT
DRSG TEGADERM 4X4.75 (GAUZE/BANDAGES/DRESSINGS) IMPLANT
ELECT COATED BLADE 2.86 ST (ELECTRODE) ×1 IMPLANT
ELECT REM PT RETURN 9FT ADLT (ELECTROSURGICAL) ×2
ELECTRODE REM PT RTRN 9FT ADLT (ELECTROSURGICAL) ×1 IMPLANT
GAUZE SPONGE 4X4 12PLY STRL LF (GAUZE/BANDAGES/DRESSINGS) IMPLANT
GLOVE SURG ENC MOIS LTX SZ7 (GLOVE) ×4 IMPLANT
GLOVE SURG UNDER POLY LF SZ7.5 (GLOVE) ×2 IMPLANT
GOWN STRL REUS W/ TWL LRG LVL3 (GOWN DISPOSABLE) ×2 IMPLANT
GOWN STRL REUS W/TWL LRG LVL3 (GOWN DISPOSABLE) ×4
HEMOSTAT ARISTA ABSORB 3G PWDR (HEMOSTASIS) IMPLANT
KIT MARKER MARGIN INK (KITS) ×2 IMPLANT
NDL HYPO 25X1 1.5 SAFETY (NEEDLE) ×1 IMPLANT
NEEDLE HYPO 25X1 1.5 SAFETY (NEEDLE) ×2 IMPLANT
NS IRRIG 1000ML POUR BTL (IV SOLUTION) ×1 IMPLANT
PACK BASIN DAY SURGERY FS (CUSTOM PROCEDURE TRAY) ×2 IMPLANT
PENCIL SMOKE EVACUATOR (MISCELLANEOUS) ×2 IMPLANT
RETRACTOR ONETRAX LX 90X20 (MISCELLANEOUS) ×1 IMPLANT
SLEEVE SCD COMPRESS KNEE MED (STOCKING) ×2 IMPLANT
SPONGE LAP 4X18 RFD (DISPOSABLE) ×2 IMPLANT
STRIP CLOSURE SKIN 1/2X4 (GAUZE/BANDAGES/DRESSINGS) ×2 IMPLANT
SUT MNCRL AB 4-0 PS2 18 (SUTURE) ×2 IMPLANT
SUT MON AB 5-0 PS2 18 (SUTURE) IMPLANT
SUT SILK 2 0 SH (SUTURE) IMPLANT
SUT VIC AB 2-0 SH 27 (SUTURE) ×4
SUT VIC AB 2-0 SH 27XBRD (SUTURE) ×1 IMPLANT
SUT VIC AB 3-0 SH 27 (SUTURE) ×2
SUT VIC AB 3-0 SH 27X BRD (SUTURE) ×1 IMPLANT
SUT VIC AB 5-0 PS2 18 (SUTURE) IMPLANT
SYR CONTROL 10ML LL (SYRINGE) ×2 IMPLANT
TOWEL GREEN STERILE FF (TOWEL DISPOSABLE) ×2 IMPLANT
TRAY FAXITRON CT DISP (TRAY / TRAY PROCEDURE) ×2 IMPLANT
TUBE CONNECTING 20X1/4 (TUBING) ×2 IMPLANT
YANKAUER SUCT BULB TIP NO VENT (SUCTIONS) ×1 IMPLANT

## 2020-12-02 NOTE — Anesthesia Procedure Notes (Signed)
Procedure Name: LMA Insertion Performed by: Rustin Erhart, Collin, CRNA Pre-anesthesia Checklist: Patient identified, Emergency Drugs available, Suction available and Patient being monitored Patient Re-evaluated:Patient Re-evaluated prior to induction Oxygen Delivery Method: Circle system utilized Preoxygenation: Pre-oxygenation with 100% oxygen Induction Type: IV induction Ventilation: Mask ventilation without difficulty LMA: LMA inserted LMA Size: 4.0 Number of attempts: 1 Airway Equipment and Method: Bite block Placement Confirmation: positive ETCO2 Tube secured with: Tape Dental Injury: Teeth and Oropharynx as per pre-operative assessment        

## 2020-12-02 NOTE — Anesthesia Preprocedure Evaluation (Signed)
Anesthesia Evaluation  Patient identified by MRN, date of birth, ID band Patient awake    Reviewed: Allergy & Precautions, NPO status , Patient's Chart, lab work & pertinent test results  Airway Mallampati: II  TM Distance: >3 FB Neck ROM: Full    Dental no notable dental hx.    Pulmonary sleep apnea ,    Pulmonary exam normal breath sounds clear to auscultation       Cardiovascular hypertension, Pt. on medications negative cardio ROS Normal cardiovascular exam Rhythm:Regular Rate:Normal     Neuro/Psych negative neurological ROS  negative psych ROS   GI/Hepatic negative GI ROS, Neg liver ROS,   Endo/Other  negative endocrine ROS  Renal/GU negative Renal ROS  negative genitourinary   Musculoskeletal negative musculoskeletal ROS (+)   Abdominal (+) + obese,   Peds negative pediatric ROS (+)  Hematology negative hematology ROS (+)   Anesthesia Other Findings Ductal Carcinoma in situ  Reproductive/Obstetrics negative OB ROS                             Anesthesia Physical Anesthesia Plan  ASA: II  Anesthesia Plan: General   Post-op Pain Management:    Induction: Intravenous  PONV Risk Score and Plan: 3 and Ondansetron, Dexamethasone, Midazolam and Treatment may vary due to age or medical condition  Airway Management Planned: LMA  Additional Equipment:   Intra-op Plan:   Post-operative Plan: Extubation in OR  Informed Consent: I have reviewed the patients History and Physical, chart, labs and discussed the procedure including the risks, benefits and alternatives for the proposed anesthesia with the patient or authorized representative who has indicated his/her understanding and acceptance.     Dental advisory given  Plan Discussed with: CRNA  Anesthesia Plan Comments:         Anesthesia Quick Evaluation

## 2020-12-02 NOTE — H&P (Signed)
2 yof presents after screening mm showed mass with calcs in the left breast. she has no prior history. no mass or dc noted. she has history of hepatic adenoma and UC (just getting screening colonoscopies now). she has fh in pgm in 70s. she has a 1.2 cm irregular mass with calcifications at 6 oclock. US shows a 1x0.9x0.7 cm mass. ax [place] is negative. the biopsy is HG er/pr neg DCIS with questionable microinvasion in the specimen. the pathologist is not sure of this. I am seeing her in [facility] to discuss options   Past Surgical History Conni Slipper, RN; 11/17/2020 8:11 AM) Breast Biopsy  Left. Oral Surgery   Diagnostic Studies History Conni Slipper, RN; 11/17/2020 8:11 AM) Colonoscopy  within last year Mammogram  within last year Pap Smear  1-5 years ago  Medication History Conni Slipper, RN; 11/17/2020 8:11 AM) Medications Reconciled  Social History Conni Slipper, RN; 11/17/2020 8:11 AM) Caffeine use  Tea. No alcohol use  No drug use  Tobacco use  Never smoker.  Family History Conni Slipper, RN; 11/17/2020 8:11 AM) Arthritis  Brother. Breast Cancer  Family Members In General. Cerebrovascular Accident  Brother. Diabetes Mellitus  Family Members In General. Heart Disease  Father. Heart disease in female family member before age 63  Migraine Headache  Mother.  Pregnancy / Birth History Conni Slipper, RN; 11/17/2020 8:11 AM) Age at menarche  60 years. Contraceptive History  Oral contraceptives. Gravida  2 Maternal age  59-25 Para  1 Regular periods   Other Problems Conni Slipper, RN; 11/17/2020 8:11 AM) Asthma  Cirrhosis Of Liver  High blood pressure  Ulcerative Colitis     Review of Systems Conni Slipper RN; 11/17/2020 8:11 AM) General Not Present- Appetite Loss, Chills, Fatigue, Fever, Night Sweats, Weight Gain and Weight Loss. Skin Not Present- Change in Wart/Mole, Dryness, Hives, Jaundice, New Lesions, Non-Healing Wounds, Rash and Ulcer. HEENT Present-  Wears glasses/contact lenses. Not Present- Earache, Hearing Loss, Hoarseness, Nose Bleed, Oral Ulcers, Ringing in the Ears, Seasonal Allergies, Sinus Pain, Sore Throat, Visual Disturbances and Yellow Eyes. Respiratory Present- Snoring. Not Present- Bloody sputum, Chronic Cough, Difficulty Breathing and Wheezing. Breast Not Present- Breast Mass, Breast Pain, Nipple Discharge and Skin Changes. Cardiovascular Not Present- Chest Pain, Difficulty Breathing Lying Down, Leg Cramps, Palpitations, Rapid Heart Rate, Shortness of Breath and Swelling of Extremities. Gastrointestinal Not Present- Abdominal Pain, Bloating, Bloody Stool, Change in Bowel Habits, Chronic diarrhea, Constipation, Difficulty Swallowing, Excessive gas, Gets full quickly at meals, Hemorrhoids, Indigestion, Nausea, Rectal Pain and Vomiting. Female Genitourinary Not Present- Frequency, Nocturia, Painful Urination, Pelvic Pain and Urgency. Musculoskeletal Not Present- Back Pain, Joint Pain, Joint Stiffness, Muscle Pain, Muscle Weakness and Swelling of Extremities. Neurological Not Present- Decreased Memory, Fainting, Headaches, Numbness, Seizures, Tingling, Tremor, Trouble walking and Weakness. Psychiatric Not Present- Anxiety, Bipolar, Change in Sleep Pattern, Depression, Fearful and Frequent crying. Endocrine Not Present- Cold Intolerance, Excessive Hunger, Hair Changes, Heat Intolerance, Hot flashes and New Diabetes. Hematology Not Present- Blood Thinners, Easy Bruising, Excessive bleeding, Gland problems, HIV and Persistent Infections.   Physical Exam Rolm Bookbinder MD; 11/20/2020 11:28 AM) General Mental Status-Alert. Orientation-Oriented X3.  Breast Nipples-No Discharge. Breast Lump-No Palpable Breast Mass.  Lymphatic Head & Neck  General Head & Neck Lymphatics: Bilateral - Description - Normal. Axillary  General Axillary Region: Bilateral - Description - Normal. Note: no [place] adenopathy     Assessment &  Plan Rolm Bookbinder MD; 11/20/2020 11:31 AM) Lori Palmer CARCINOMA IN SITU (DCIS) OF LEFT BREAST (D05.12) Story:  Left breast seed guided lumpectomy We discussed the staging and pathophysiology of breast cancer. We discussed all of the different options for treatment for breast cancer including surgery, chemotherapy, radiation therapy, Herceptin, and antiestrogen therapy. We discussed a sentinel lymph node biopsy. there is certainly a chance with question of microinvasion and a mass on imaging that there could be invasive disease. we discussed pros and cons of sn now. we elected to wait on final pathology to hopefully avoid lymphedema, shoulder dysfunction, neuropathic pain in this active patient. she understands it may require a return to the OR We discussed the options for treatment of the breast cancer which included lumpectomy versus a mastectomy. We discussed the performance of the lumpectomy with radioactive seed placement. We discussed a 5-10% chance of a positive margin requiring reexcision in the operating room. We also discussed that she likely need radiation therapy if she undergoes lumpectomy. We discussed mastectomy and the postoperative care for that as well. Mastectomy can be followed by reconstruction. Most mastectomy patients will not need radiation therapy. We discussed that there is no difference in her survival whether she undergoes lumpectomy with radiation therapy or antiestrogen therapy versus a mastectomy. There is also no real difference between her recurrence in the breast. We discussed the risks of operation including bleeding, infection, possible reoperation. She understands her further therapy will be based on what her stages at the time of her operation.

## 2020-12-02 NOTE — Op Note (Signed)
Preoperative diagnosis: Clinical stage 0 left breast cancer Postoperative diagnosis: Same as above Procedure:Left breast radioactive seed guided lumpectomy Surgeon: Dr. Serita Grammes Anesthesia: General with a pectoral block Estimated blood loss: Minimal Complications: None Drains: None Specimens: 1. Left breast tissue marked with paint containing seed and clip 2. Additional anterior margin marked with paint Sponge count was correct at completion Disposition to recovery stable addition  Indications:60 yof presents after screening mm showed mass with calcs in the left breast. she has a 1.2 cm irregular mass with calcifications at 6 oclock. US shows a 1x0.9x0.7 cm mass. ax [place] is negative. the biopsy is HG er/pr neg DCIS with questionable microinvasion in the specimen. the pathologist is not sure of this.   Procedure: After informed consent was obtained the patient was taken to the OR. She had SCDs in place. She was given antibiotics. She was then placed under general anesthesia without complication. She was prepped and draped in the standard sterile surgical fashion. Surgical timeout was then performed.  I infiltrated marcaine in the lower breast and then made an inframammary incision to hide the scar. I dissected to the seed. I then removed the seed and the surrounding tissue with an attempt to get a clear margin.  I did 3D imaging and all margins looked clear except the anterior margin.  I then removed more of this so this is the skin now.  I then obtained hemostasis.  I closed the breast tissue with 2-0 vicryl also. The skin was closed with 3-0 Vicryl and 4-0 Monocryl. Glue and Steri-Strips were applied. She tolerated this well was extubated transferred to recovery stable.

## 2020-12-02 NOTE — Interval H&P Note (Signed)
History and Physical Interval Note:  12/02/2020 1:00 PM  Lori Palmer  has presented today for surgery, with the diagnosis of LEFT BREAST CANCER.  The various methods of treatment have been discussed with the patient and family. After consideration of risks, benefits and other options for treatment, the patient has consented to  Procedure(s): LEFT BREAST LUMPECTOMY WITH RADIOACTIVE SEED LOCALIZATION (Left) as a surgical intervention.  The patient's history has been reviewed, patient examined, no change in status, stable for surgery.  I have reviewed the patient's chart and labs.  Questions were answered to the patient's satisfaction.     Rolm Bookbinder

## 2020-12-02 NOTE — Transfer of Care (Signed)
Immediate Anesthesia Transfer of Care Note  Patient: Lori Palmer  Procedure(s) Performed: LEFT BREAST LUMPECTOMY WITH RADIOACTIVE SEED LOCALIZATION (Left Breast)  Patient Location: PACU  Anesthesia Type:General  Level of Consciousness: awake  Airway & Oxygen Therapy: Patient Spontanous Breathing and Patient connected to face mask oxygen  Post-op Assessment: Report given to RN and Post -op Vital signs reviewed and stable  Post vital signs: Reviewed and stable  Last Vitals:  Vitals Value Taken Time  BP    Temp    Pulse 74 12/02/20 1459  Resp 12 12/02/20 1459  SpO2 100 % 12/02/20 1459  Vitals shown include unvalidated device data.  Last Pain:  Vitals:   12/02/20 1208  TempSrc: Oral  PainSc: 0-No pain      Patients Stated Pain Goal: 1 (78/67/54 [id number])  Complications: No complications documented.

## 2020-12-02 NOTE — Discharge Instructions (Signed)
[place] Office Phone Number [phone]  BREAST BIOPSY/ PARTIAL MASTECTOMY: POST OP INSTRUCTIONS Take 400 mg of ibuprofen every 8 hours or 650 mg tylenol every 6 hours for next 72 hours then as needed. Use ice several times daily also. Always review your discharge instruction sheet given to you by the facility where your surgery was performed.  IF YOU HAVE DISABILITY OR FAMILY LEAVE FORMS, YOU MUST BRING THEM TO THE OFFICE FOR PROCESSING.  DO NOT GIVE THEM TO YOUR DOCTOR.  1. A prescription for pain medication may be given to you upon discharge.  Take your pain medication as prescribed, if needed.  If narcotic pain medicine is not needed, then you may take acetaminophen (Tylenol), naprosyn (Alleve) or ibuprofen (Advil) as needed. *No tylenol or Ibuprofen until after 6:15pm 2. Take your usually prescribed medications unless otherwise directed 3. If you need a refill on your pain medication, please contact your pharmacy.  They will contact our office to request authorization.  Prescriptions will not be filled after 5pm or on week-ends. 4. You should eat very light the first 24 hours after surgery, such as soup, crackers, pudding, etc.  Resume your normal diet the day after surgery. 5. Most patients will experience some swelling and bruising in the breast.  Ice packs and a good support bra will help.  Wear the breast binder provided or a sports bra for 72 hours day and night.  After that wear a sports bra during the day until you return to the office. Swelling and bruising can take several days to resolve.  6. It is common to experience some constipation if taking pain medication after surgery.  Increasing fluid intake and taking a stool softener will usually help or prevent this problem from occurring.  A mild laxative (Milk of Magnesia or Miralax) should be taken according to package directions if there are no bowel movements after 48 hours. 7. Unless discharge instructions  indicate otherwise, you may remove your bandages 48 hours after surgery and you may shower at that time.  You may have steri-strips (small skin tapes) in place directly over the incision.  These strips should be left on the skin for 7-10 days and will come off on their own.  If your surgeon used skin glue on the incision, you may shower in 24 hours.  The glue will flake off over the next 2-3 weeks.  Any sutures or staples will be removed at the office during your follow-up visit. 8. ACTIVITIES:  You may resume regular daily activities (gradually increasing) beginning the next day.  Wearing a good support bra or sports bra minimizes pain and swelling.  You may have sexual intercourse when it is comfortable. a. You may drive when you no longer are taking prescription pain medication, you can comfortably wear a seatbelt, and you can safely maneuver your car and apply brakes. b. RETURN TO WORK:  ______________________________________________________________________________________ 9. You should see your doctor in the office for a follow-up appointment approximately two weeks after your surgery.  Your doctor's nurse will typically make your follow-up appointment when she calls you with your pathology report.  Expect your pathology report 3-4 business days after your surgery.  You may call to check if you do not hear from [place] after three days. 10. OTHER INSTRUCTIONS: _______________________________________________________________________________________________ _____________________________________________________________________________________________________________________________________ _____________________________________________________________________________________________________________________________________ _____________________________________________________________________________________________________________________________________  WHEN TO CALL DR WAKEFIELD: 1. Fever over 101.0 2. Nausea  and/or vomiting. 3. Extreme swelling or bruising. 4. Continued bleeding from incision. 5. Increased pain, redness, or  drainage from the incision.  The clinic staff is available to answer your questions during regular business hours.  Please don't hesitate to call and ask to speak to one of the nurses for clinical concerns.  If you have a medical emergency, go to the nearest emergency room or call 911.  A surgeon from [facility] is always on call at the hospital.  For further questions, please visit [email or web address]  Post Anesthesia Home Care Instructions  Activity: Get plenty of rest for the remainder of the day. A responsible individual must stay with you for 24 hours following the procedure.  For the next 24 hours, DO NOT: -Drive a car -Paediatric nurse -Drink alcoholic beverages -Take any medication unless instructed by your physician -Make any legal decisions or sign important papers.  Meals: Start with liquid foods such as gelatin or soup. Progress to regular foods as tolerated. Avoid greasy, spicy, heavy foods. If nausea and/or vomiting occur, drink only clear liquids until the nausea and/or vomiting subsides. Call your physician if vomiting continues.  Special Instructions/Symptoms: Your throat may feel dry or sore from the anesthesia or the breathing tube placed in your throat during surgery. If this causes discomfort, gargle with warm salt water. The discomfort should disappear within 24 hours.  If you had a scopolamine patch placed behind your ear for the management of post- operative nausea and/or vomiting:  1. The medication in the patch is effective for 72 hours, after which it should be removed.  Wrap patch in a tissue and discard in the trash. Wash hands thoroughly with soap and water. 2. You may remove the patch earlier than 72 hours if you experience unpleasant side effects which may include dry mouth, dizziness or visual disturbances. 3.  Avoid touching the patch. Wash your hands with soap and water after contact with the patch.

## 2020-12-02 NOTE — Anesthesia Postprocedure Evaluation (Signed)
Anesthesia Post Note  Patient: Lori Palmer  Procedure(s) Performed: LEFT BREAST LUMPECTOMY WITH RADIOACTIVE SEED LOCALIZATION (Left Breast)     Patient location during evaluation: PACU Anesthesia Type: General Level of consciousness: awake and alert Pain management: pain level controlled Vital Signs Assessment: post-procedure vital signs reviewed and stable Respiratory status: spontaneous breathing, nonlabored ventilation and respiratory function stable Cardiovascular status: blood pressure returned to baseline and stable Postop Assessment: no apparent nausea or vomiting Anesthetic complications: no   No complications documented.  Last Vitals:  Vitals:   12/02/20 1530 12/02/20 1540  BP: 117/78 (P) 129/74  Pulse: 73 (P) 72  Resp: 12 (P) 16  Temp:  (P) 36.6 C  SpO2: 100% (P) 99%    Last Pain:  Vitals:   12/02/20 1540  TempSrc:   PainSc: (P) 0-No pain                 Lynda Rainwater

## 2020-12-03 ENCOUNTER — Encounter (HOSPITAL_BASED_OUTPATIENT_CLINIC_OR_DEPARTMENT_OTHER): Payer: Self-pay | Admitting: General Surgery

## 2020-12-06 DIAGNOSIS — D0512 Intraductal carcinoma in situ of left breast: Secondary | ICD-10-CM | POA: Diagnosis not present

## 2020-12-07 ENCOUNTER — Other Ambulatory Visit: Payer: Self-pay

## 2020-12-08 ENCOUNTER — Encounter: Payer: Self-pay | Admitting: *Deleted

## 2020-12-08 LAB — SURGICAL PATHOLOGY

## 2020-12-17 DIAGNOSIS — Z510 Encounter for antineoplastic radiation therapy: Secondary | ICD-10-CM | POA: Diagnosis not present

## 2020-12-17 DIAGNOSIS — D0512 Intraductal carcinoma in situ of left breast: Secondary | ICD-10-CM | POA: Diagnosis not present

## 2020-12-21 DIAGNOSIS — D0512 Intraductal carcinoma in situ of left breast: Secondary | ICD-10-CM | POA: Diagnosis not present

## 2020-12-21 DIAGNOSIS — D2239 Melanocytic nevi of other parts of face: Secondary | ICD-10-CM | POA: Diagnosis not present

## 2020-12-21 DIAGNOSIS — D485 Neoplasm of uncertain behavior of skin: Secondary | ICD-10-CM | POA: Diagnosis not present

## 2020-12-21 DIAGNOSIS — Z510 Encounter for antineoplastic radiation therapy: Secondary | ICD-10-CM | POA: Diagnosis not present

## 2020-12-21 DIAGNOSIS — L820 Inflamed seborrheic keratosis: Secondary | ICD-10-CM | POA: Diagnosis not present

## 2020-12-23 ENCOUNTER — Telehealth: Payer: Self-pay | Admitting: Hematology

## 2020-12-23 ENCOUNTER — Encounter: Payer: Self-pay | Admitting: *Deleted

## 2020-12-23 DIAGNOSIS — D0512 Intraductal carcinoma in situ of left breast: Secondary | ICD-10-CM | POA: Diagnosis not present

## 2020-12-23 DIAGNOSIS — Z510 Encounter for antineoplastic radiation therapy: Secondary | ICD-10-CM | POA: Diagnosis not present

## 2020-12-23 NOTE — Telephone Encounter (Signed)
Scheduled appt per 5/26 sch msg. Called pt, no answer. Left msg with appt date and time.

## 2020-12-28 DIAGNOSIS — D0512 Intraductal carcinoma in situ of left breast: Secondary | ICD-10-CM | POA: Diagnosis not present

## 2020-12-28 DIAGNOSIS — Z510 Encounter for antineoplastic radiation therapy: Secondary | ICD-10-CM | POA: Diagnosis not present

## 2020-12-29 DIAGNOSIS — D0512 Intraductal carcinoma in situ of left breast: Secondary | ICD-10-CM | POA: Diagnosis not present

## 2020-12-29 DIAGNOSIS — Z510 Encounter for antineoplastic radiation therapy: Secondary | ICD-10-CM | POA: Diagnosis not present

## 2020-12-30 DIAGNOSIS — Z510 Encounter for antineoplastic radiation therapy: Secondary | ICD-10-CM | POA: Diagnosis not present

## 2020-12-30 DIAGNOSIS — D0512 Intraductal carcinoma in situ of left breast: Secondary | ICD-10-CM | POA: Diagnosis not present

## 2020-12-31 DIAGNOSIS — D0512 Intraductal carcinoma in situ of left breast: Secondary | ICD-10-CM | POA: Diagnosis not present

## 2020-12-31 DIAGNOSIS — Z510 Encounter for antineoplastic radiation therapy: Secondary | ICD-10-CM | POA: Diagnosis not present

## 2021-01-03 DIAGNOSIS — Z510 Encounter for antineoplastic radiation therapy: Secondary | ICD-10-CM | POA: Diagnosis not present

## 2021-01-03 DIAGNOSIS — D0512 Intraductal carcinoma in situ of left breast: Secondary | ICD-10-CM | POA: Diagnosis not present

## 2021-01-04 DIAGNOSIS — Z510 Encounter for antineoplastic radiation therapy: Secondary | ICD-10-CM | POA: Diagnosis not present

## 2021-01-04 DIAGNOSIS — D0512 Intraductal carcinoma in situ of left breast: Secondary | ICD-10-CM | POA: Diagnosis not present

## 2021-01-05 DIAGNOSIS — D0512 Intraductal carcinoma in situ of left breast: Secondary | ICD-10-CM | POA: Diagnosis not present

## 2021-01-05 DIAGNOSIS — Z510 Encounter for antineoplastic radiation therapy: Secondary | ICD-10-CM | POA: Diagnosis not present

## 2021-01-06 DIAGNOSIS — D0512 Intraductal carcinoma in situ of left breast: Secondary | ICD-10-CM | POA: Diagnosis not present

## 2021-01-06 DIAGNOSIS — Z510 Encounter for antineoplastic radiation therapy: Secondary | ICD-10-CM | POA: Diagnosis not present

## 2021-01-07 DIAGNOSIS — D0512 Intraductal carcinoma in situ of left breast: Secondary | ICD-10-CM | POA: Diagnosis not present

## 2021-01-07 DIAGNOSIS — Z510 Encounter for antineoplastic radiation therapy: Secondary | ICD-10-CM | POA: Diagnosis not present

## 2021-01-10 DIAGNOSIS — Z510 Encounter for antineoplastic radiation therapy: Secondary | ICD-10-CM | POA: Diagnosis not present

## 2021-01-10 DIAGNOSIS — D0512 Intraductal carcinoma in situ of left breast: Secondary | ICD-10-CM | POA: Diagnosis not present

## 2021-01-11 DIAGNOSIS — Z510 Encounter for antineoplastic radiation therapy: Secondary | ICD-10-CM | POA: Diagnosis not present

## 2021-01-11 DIAGNOSIS — D0512 Intraductal carcinoma in situ of left breast: Secondary | ICD-10-CM | POA: Diagnosis not present

## 2021-01-12 DIAGNOSIS — Z510 Encounter for antineoplastic radiation therapy: Secondary | ICD-10-CM | POA: Diagnosis not present

## 2021-01-12 DIAGNOSIS — D0512 Intraductal carcinoma in situ of left breast: Secondary | ICD-10-CM | POA: Diagnosis not present

## 2021-01-13 DIAGNOSIS — D0512 Intraductal carcinoma in situ of left breast: Secondary | ICD-10-CM | POA: Diagnosis not present

## 2021-01-13 DIAGNOSIS — Z510 Encounter for antineoplastic radiation therapy: Secondary | ICD-10-CM | POA: Diagnosis not present

## 2021-01-14 DIAGNOSIS — Z510 Encounter for antineoplastic radiation therapy: Secondary | ICD-10-CM | POA: Diagnosis not present

## 2021-01-14 DIAGNOSIS — D0512 Intraductal carcinoma in situ of left breast: Secondary | ICD-10-CM | POA: Diagnosis not present

## 2021-01-17 DIAGNOSIS — D0512 Intraductal carcinoma in situ of left breast: Secondary | ICD-10-CM | POA: Diagnosis not present

## 2021-01-17 DIAGNOSIS — Z510 Encounter for antineoplastic radiation therapy: Secondary | ICD-10-CM | POA: Diagnosis not present

## 2021-01-18 DIAGNOSIS — D0512 Intraductal carcinoma in situ of left breast: Secondary | ICD-10-CM | POA: Diagnosis not present

## 2021-01-18 DIAGNOSIS — Z510 Encounter for antineoplastic radiation therapy: Secondary | ICD-10-CM | POA: Diagnosis not present

## 2021-01-19 DIAGNOSIS — D0512 Intraductal carcinoma in situ of left breast: Secondary | ICD-10-CM | POA: Diagnosis not present

## 2021-01-19 DIAGNOSIS — Z510 Encounter for antineoplastic radiation therapy: Secondary | ICD-10-CM | POA: Diagnosis not present

## 2021-01-20 DIAGNOSIS — D0512 Intraductal carcinoma in situ of left breast: Secondary | ICD-10-CM | POA: Diagnosis not present

## 2021-01-20 DIAGNOSIS — Z510 Encounter for antineoplastic radiation therapy: Secondary | ICD-10-CM | POA: Diagnosis not present

## 2021-01-21 DIAGNOSIS — Z510 Encounter for antineoplastic radiation therapy: Secondary | ICD-10-CM | POA: Diagnosis not present

## 2021-01-21 DIAGNOSIS — D0512 Intraductal carcinoma in situ of left breast: Secondary | ICD-10-CM | POA: Diagnosis not present

## 2021-01-24 DIAGNOSIS — D0512 Intraductal carcinoma in situ of left breast: Secondary | ICD-10-CM | POA: Diagnosis not present

## 2021-01-24 DIAGNOSIS — Z510 Encounter for antineoplastic radiation therapy: Secondary | ICD-10-CM | POA: Diagnosis not present

## 2021-01-25 DIAGNOSIS — D0512 Intraductal carcinoma in situ of left breast: Secondary | ICD-10-CM | POA: Diagnosis not present

## 2021-01-25 DIAGNOSIS — Z510 Encounter for antineoplastic radiation therapy: Secondary | ICD-10-CM | POA: Diagnosis not present

## 2021-01-26 DIAGNOSIS — D0512 Intraductal carcinoma in situ of left breast: Secondary | ICD-10-CM | POA: Diagnosis not present

## 2021-01-26 DIAGNOSIS — Z510 Encounter for antineoplastic radiation therapy: Secondary | ICD-10-CM | POA: Diagnosis not present

## 2021-01-27 DIAGNOSIS — D0512 Intraductal carcinoma in situ of left breast: Secondary | ICD-10-CM | POA: Diagnosis not present

## 2021-01-27 DIAGNOSIS — Z510 Encounter for antineoplastic radiation therapy: Secondary | ICD-10-CM | POA: Diagnosis not present

## 2021-01-28 DIAGNOSIS — Z510 Encounter for antineoplastic radiation therapy: Secondary | ICD-10-CM | POA: Diagnosis not present

## 2021-01-28 DIAGNOSIS — D0512 Intraductal carcinoma in situ of left breast: Secondary | ICD-10-CM | POA: Diagnosis not present

## 2021-02-01 DIAGNOSIS — D0512 Intraductal carcinoma in situ of left breast: Secondary | ICD-10-CM | POA: Diagnosis not present

## 2021-02-01 DIAGNOSIS — Z510 Encounter for antineoplastic radiation therapy: Secondary | ICD-10-CM | POA: Diagnosis not present

## 2021-02-02 DIAGNOSIS — D0512 Intraductal carcinoma in situ of left breast: Secondary | ICD-10-CM | POA: Diagnosis not present

## 2021-02-02 DIAGNOSIS — Z510 Encounter for antineoplastic radiation therapy: Secondary | ICD-10-CM | POA: Diagnosis not present

## 2021-02-03 DIAGNOSIS — D0512 Intraductal carcinoma in situ of left breast: Secondary | ICD-10-CM | POA: Diagnosis not present

## 2021-02-03 DIAGNOSIS — Z510 Encounter for antineoplastic radiation therapy: Secondary | ICD-10-CM | POA: Diagnosis not present

## 2021-02-04 DIAGNOSIS — D0512 Intraductal carcinoma in situ of left breast: Secondary | ICD-10-CM | POA: Diagnosis not present

## 2021-02-04 DIAGNOSIS — Z510 Encounter for antineoplastic radiation therapy: Secondary | ICD-10-CM | POA: Diagnosis not present

## 2021-02-07 ENCOUNTER — Encounter: Payer: Self-pay | Admitting: *Deleted

## 2021-02-07 DIAGNOSIS — D0512 Intraductal carcinoma in situ of left breast: Secondary | ICD-10-CM | POA: Diagnosis not present

## 2021-02-07 DIAGNOSIS — Z510 Encounter for antineoplastic radiation therapy: Secondary | ICD-10-CM | POA: Diagnosis not present

## 2021-02-08 DIAGNOSIS — D0512 Intraductal carcinoma in situ of left breast: Secondary | ICD-10-CM | POA: Diagnosis not present

## 2021-02-08 DIAGNOSIS — Z510 Encounter for antineoplastic radiation therapy: Secondary | ICD-10-CM | POA: Diagnosis not present

## 2021-02-09 DIAGNOSIS — Z510 Encounter for antineoplastic radiation therapy: Secondary | ICD-10-CM | POA: Diagnosis not present

## 2021-02-09 DIAGNOSIS — D0512 Intraductal carcinoma in situ of left breast: Secondary | ICD-10-CM | POA: Diagnosis not present

## 2021-02-10 DIAGNOSIS — Z510 Encounter for antineoplastic radiation therapy: Secondary | ICD-10-CM | POA: Diagnosis not present

## 2021-02-10 DIAGNOSIS — D0512 Intraductal carcinoma in situ of left breast: Secondary | ICD-10-CM | POA: Diagnosis not present

## 2021-02-21 ENCOUNTER — Ambulatory Visit: Payer: BC Managed Care – PPO | Admitting: Hematology

## 2021-03-10 ENCOUNTER — Telehealth: Payer: Self-pay | Admitting: Hematology

## 2021-03-10 NOTE — Telephone Encounter (Signed)
Scheduled appt per 8/11 sch msg. Called pt, no answer. Left msg with appt date and time.

## 2021-03-14 ENCOUNTER — Inpatient Hospital Stay: Payer: BC Managed Care – PPO | Admitting: Hematology

## 2021-03-23 DIAGNOSIS — J309 Allergic rhinitis, unspecified: Secondary | ICD-10-CM | POA: Diagnosis not present

## 2021-03-23 DIAGNOSIS — I10 Essential (primary) hypertension: Secondary | ICD-10-CM | POA: Diagnosis not present

## 2021-03-23 DIAGNOSIS — K5190 Ulcerative colitis, unspecified, without complications: Secondary | ICD-10-CM | POA: Diagnosis not present

## 2021-03-23 DIAGNOSIS — J4520 Mild intermittent asthma, uncomplicated: Secondary | ICD-10-CM | POA: Diagnosis not present

## 2021-03-30 NOTE — Progress Notes (Signed)
[place]   Telephone:([phone]) [phone] Fax:([phone]) [phone]   Clinic Follow up Note   Patient Care Team: Carol Ada, MD as PCP - General (Family Medicine) Rockwell Germany, RN as Oncology Nurse Navigator Mauro Kaufmann, RN as Oncology Nurse Navigator Rolm Bookbinder, MD as Consulting Physician (General Surgery) Truitt Merle, MD as Consulting Physician (Hematology) Gery Pray, MD as Consulting Physician (Radiation Oncology)  Date of Service:  03/31/2021  CHIEF COMPLAINT: f/u of left breast DCIS  CURRENT THERAPY:  Surveillance  ASSESSMENT & PLAN:  Lori Palmer is a 60 y.o. female with   1. Left breast DCIS, grade III, ER-/PR- -left breast mass was found by screening mammogram with 1cm mass. Biopsy showed this to be DCIS with concern for microinvasion.  -she had lumpectomy on 12/02/20 under Dr. Donne Hazel. Pathology showed high grade DCIS with necrosis, with intraductal papilloma. -Her DCIS was cured by complete surgical resection. Any form of adjuvant therapy is preventive. -she received adjuvant radiation therapy at [facility]. -Given her negative ER and PR markers, I do not recommend antiestrogen therapy  -We discussed breast cancer surveillance after she completes treatment, Including annual mammogram, breast exam every 6-12 months. Given her high risk for future breast cancer, I recommend annual screening breast MRI also. I will reach out to her insurance to see if it is covered.   2. Genetics  -with only one member of family history of breast cancer, she is not eligible for genetic testing to be covered by insurance. She met with our genetic counselor and declined proceeding with testing due to cost.     PLAN:  -I will order screening breast MRI, she will proceed if insurance approves.  -I will call her with the results -f/u as needed    No problem-specific Assessment & Plan notes found for this encounter.   SUMMARY OF ONCOLOGIC HISTORY: Oncology  History Overview Note  Cancer Staging Ductal carcinoma in situ (DCIS) of left breast Staging form: Breast, AJCC 8th Edition - Clinical stage from 11/05/2020: Stage 0 (cTis (DCIS), cN0, cM0, G3, ER-, PR-, HER2: Not Assessed) - Signed by Truitt Merle, MD on 11/17/2020 Stage prefix: Initial diagnosis Histologic grading system: 3 grade system    Ductal carcinoma in situ (DCIS) of left breast  10/26/2020 Breast US   Impression  The 1x0.9x0.7cm mass in the left breast at 6:00 position middle depth, 5cmfnin the left breast is suspicious for malignancy. Biopsy recommended.     11/05/2020 Cancer Staging   Staging form: Breast, AJCC 8th Edition - Clinical stage from 11/05/2020: Stage 0 (cTis (DCIS), cN0, cM0, G3, ER-, PR-, HER2: Not Assessed) - Signed by Truitt Merle, MD on 11/17/2020 Stage prefix: Initial diagnosis Histologic grading system: 3 grade system   11/08/2020 Initial Biopsy   Diagnosis:  Bresat, left, needle core biopsy, left bresat - 1cm mass at 6:00 position middle depth 3cmfn  -DUCTAL CARCINOMA INSITU, HIGH GRADE, MICROINVASION CANNOT BE EXCLUDED  -SEE COMMENT   ER NEGATIVE PR NEGATIVE    11/12/2020 Initial Diagnosis   Ductal carcinoma in situ (DCIS) of left breast   12/02/2020 Cancer Staging   Staging form: Breast, AJCC 8th Edition - Pathologic stage from 12/02/2020: Stage Unknown (pTis (DCIS), pNX, cM0, G3, ER-, PR-, HER2: Not Assessed) - Signed by Truitt Merle, MD on 03/30/2021 Stage prefix: Initial diagnosis Histologic grading system: 3 grade system   12/02/2020 Pathology Results   FINAL MICROSCOPIC DIAGNOSIS:   A. BREAST, LEFT, LUMPECTOMY:  - High grade ductal carcinoma in  situ with necrosis.  - In situ carcinoma is <1 mm from the medial margin focally.  - Intraductal papilloma.  - Biopsy site.  - See oncology table.   B. BREAST, LEFT ADDITIONAL ANTERIOR MARGIN, EXCISION:  - Intraductal papilloma.  - Fibrocystic change and usual ductal hyperplasia.  - No malignancy identified.      - 02/10/2021 Radiation Therapy   Breast radiation at [facility] HISTORY:  Lori Palmer is here for a follow up of breast cancer. She was last seen by me on 11/17/20. She presents to the clinic alone. She reports she has recovered from radiation well-- "everything looks normal again."   All other systems were reviewed with the patient and are negative.  MEDICAL HISTORY:  Past Medical History:  Diagnosis Date   Allergy    albuterol PRN   Breast cancer ([place])    Family history of breast cancer 11/18/2020   Hepatic adenoma    Hypertension    Sleep apnea    Ulcerative colitis ([place])    Ulcerative colitis ([place])     SURGICAL HISTORY: Past Surgical History:  Procedure Laterality Date   BREAST LUMPECTOMY WITH RADIOACTIVE SEED LOCALIZATION Left 12/02/2020   Procedure: LEFT BREAST LUMPECTOMY WITH RADIOACTIVE SEED LOCALIZATION;  Surgeon: Rolm Bookbinder, MD;  Location: [facility];  Service: General;  Laterality: Left;   DIAGNOSTIC LAPAROSCOPY     ectopic pregnancy    I have reviewed the social history and family history with the patient and they are unchanged from previous note.  ALLERGIES:  is allergic to penicillins, other, and codeine.  MEDICATIONS:  Current Outpatient Medications  Medication Sig Dispense Refill   albuterol (VENTOLIN HFA) 108 (90 Base) MCG/ACT inhaler 1 puff as needed     carvedilol (COREG) 12.5 MG tablet Take 12.5 mg by mouth 2 (two) times daily.     citalopram (CELEXA) 10 MG tablet Take 10 mg by mouth daily.     cyclobenzaprine (FLEXERIL) 5 MG tablet 1 tablet     fluticasone (FLONASE) 50 MCG/ACT nasal spray 1 spray in each nostril     fluticasone (FLOVENT HFA) 44 MCG/ACT inhaler 1 puff     LORazepam (ATIVAN) 0.5 MG tablet 1/2 tab     losartan-hydrochlorothiazide (HYZAAR) 50-12.5 MG tablet Take 1 tablet by mouth daily.     mesalamine (LIALDA) 1.2 g EC tablet 4 tablets     Nutritional Supplements (JUICE PLUS  FIBRE PO) Take by mouth.     traMADol (ULTRAM) 50 MG tablet Take 1 tablet (50 mg total) by mouth every 6 (six) hours as needed. 10 tablet 0   No current facility-administered medications for this visit.    PHYSICAL EXAMINATION: ECOG PERFORMANCE STATUS: 0 - Asymptomatic  Vitals:   03/31/21 0822  BP: 129/78  Pulse: 71  Resp: 19  Temp: 98.5 F (36.9 C)  SpO2: 98%   Wt Readings from Last 3 Encounters:  03/31/21 207 lb 14.4 oz (94.3 kg)  12/02/20 208 lb 15.9 oz (94.8 kg)  11/17/20 208 lb 6.4 oz (94.5 kg)     GENERAL:alert, no distress and comfortable SKIN: skin color normal, no rashes or significant lesions EYES: normal, Conjunctiva are pink and non-injected, sclera clear  NEURO: alert & oriented x 3 with fluent speech  LABORATORY DATA:  I have reviewed the data as listed CBC Latest Ref Rng & Units 11/17/2020  WBC 4.0 - 10.5 K/uL 5.2  Hemoglobin 12.0 - 15.0 g/dL 13.9  Hematocrit 36.0 - 46.0 % 41.3  Platelets 150 - 400 K/uL 204     CMP Latest Ref Rng & Units 12/01/2020 11/17/2020  Glucose 70 - 99 mg/dL 101(H) 109(H)  BUN 6 - 20 mg/dL 15 13  Creatinine 0.44 - 1.00 mg/dL 0.76 0.78  Sodium 135 - 145 mmol/L 138 143  Potassium 3.5 - 5.1 mmol/L 4.3 4.1  Chloride 98 - 111 mmol/L 101 104  CO2 22 - 32 mmol/L 30 30  Calcium 8.9 - 10.3 mg/dL 9.4 9.3  Total Protein 6.5 - 8.1 g/dL - 7.4  Total Bilirubin 0.3 - 1.2 mg/dL - 1.4(H)  Alkaline Phos 38 - 126 U/L - 75  AST 15 - 41 U/L - 28  ALT 0 - 44 U/L - 43      RADIOGRAPHIC STUDIES: I have personally reviewed the radiological images as listed and agreed with the findings in the report. No results found.    Orders Placed This Encounter  Procedures   MR BREAST BILATERAL W WO CONTRAST INC CAD    Standing Status:   Future    Standing Expiration Date:   03/31/2022    Order Specific Question:   If indicated for the ordered procedure, I authorize the administration of contrast media per Radiology protocol    Answer:   Yes    Order  Specific Question:   What is the patient's sedation requirement?    Answer:   No Sedation    Order Specific Question:   Does the patient have a pacemaker or implanted devices?    Answer:   No    Order Specific Question:   Radiology Contrast Protocol - do NOT remove file path    Answer:   [email or web address]    Order Specific Question:   Preferred imaging location?    Answer:   [facility] (table limit-550lbs)    All questions were answered. The patient knows to call the clinic with any problems, questions or concerns. No barriers to learning was detected. The total time spent in the appointment was 20 minutes.     Truitt Merle, MD 03/31/2021   I, Wilburn Mylar, am acting as scribe for Truitt Merle, MD.   I have reviewed the above documentation for accuracy and completeness, and I agree with the above.

## 2021-03-31 ENCOUNTER — Other Ambulatory Visit: Payer: Self-pay

## 2021-03-31 ENCOUNTER — Inpatient Hospital Stay: Payer: BC Managed Care – PPO | Attending: Hematology | Admitting: Hematology

## 2021-03-31 VITALS — BP 129/78 | HR 71 | Temp 98.5°F | Resp 19 | Ht 68.0 in | Wt 207.9 lb

## 2021-03-31 DIAGNOSIS — D0512 Intraductal carcinoma in situ of left breast: Secondary | ICD-10-CM

## 2021-03-31 DIAGNOSIS — Z86000 Personal history of in-situ neoplasm of breast: Secondary | ICD-10-CM | POA: Insufficient documentation

## 2021-03-31 DIAGNOSIS — Z803 Family history of malignant neoplasm of breast: Secondary | ICD-10-CM | POA: Insufficient documentation

## 2021-04-22 ENCOUNTER — Encounter: Payer: Self-pay | Admitting: Hematology

## 2021-04-29 DIAGNOSIS — D2239 Melanocytic nevi of other parts of face: Secondary | ICD-10-CM | POA: Diagnosis not present

## 2021-04-29 DIAGNOSIS — L570 Actinic keratosis: Secondary | ICD-10-CM | POA: Diagnosis not present

## 2021-04-29 DIAGNOSIS — D225 Melanocytic nevi of trunk: Secondary | ICD-10-CM | POA: Diagnosis not present

## 2021-04-29 DIAGNOSIS — D485 Neoplasm of uncertain behavior of skin: Secondary | ICD-10-CM | POA: Diagnosis not present

## 2021-04-30 ENCOUNTER — Other Ambulatory Visit: Payer: Self-pay

## 2021-04-30 ENCOUNTER — Ambulatory Visit
Admission: RE | Admit: 2021-04-30 | Discharge: 2021-04-30 | Disposition: A | Discharge status: Home / Self Care | Payer: BC Managed Care – PPO | Source: Ambulatory Visit | Attending: Hematology | Admitting: Hematology

## 2021-04-30 DIAGNOSIS — R928 Other abnormal and inconclusive findings on diagnostic imaging of breast: Secondary | ICD-10-CM | POA: Diagnosis not present

## 2021-04-30 DIAGNOSIS — D0512 Intraductal carcinoma in situ of left breast: Secondary | ICD-10-CM

## 2021-04-30 MED ORDER — GADOBUTROL 1 MMOL/ML IV SOLN
10.0000 mL | Freq: Once | INTRAVENOUS | Status: AC | PRN
  Administered 2021-04-30: 10 mL via INTRAVENOUS

## 2021-05-03 ENCOUNTER — Other Ambulatory Visit: Payer: Self-pay | Admitting: Hematology

## 2021-05-03 DIAGNOSIS — R9389 Abnormal findings on diagnostic imaging of other specified body structures: Secondary | ICD-10-CM

## 2021-05-05 ENCOUNTER — Telehealth: Payer: Self-pay | Admitting: Hematology

## 2021-05-05 ENCOUNTER — Telehealth: Payer: Self-pay | Admitting: *Deleted

## 2021-05-05 NOTE — Telephone Encounter (Signed)
Spoke to pt regarding breast MRI and need for RIGHT MRI bx. Pt was confused as to why she needed a bx on the right and why it needed to be a MRI bx. Discussed the reasoning and what Non mass enhancement is.

## 2021-05-05 NOTE — Telephone Encounter (Signed)
I called pt and discussed her recent breast MRI findings. I recommend the abnormal area in right breat on MRI, she is scheduled for biopsy next week 10/14, all questions were answered, she appreciated the call.  Lori Palmer  05/05/2021

## 2021-05-13 ENCOUNTER — Inpatient Hospital Stay: Admission: RE | Admit: 2021-05-13 | Payer: BC Managed Care – PPO | Source: Ambulatory Visit

## 2021-05-20 ENCOUNTER — Ambulatory Visit
Admission: RE | Admit: 2021-05-20 | Discharge: 2021-05-20 | Disposition: A | Discharge status: Home / Self Care | Payer: BC Managed Care – PPO | Source: Ambulatory Visit | Attending: Hematology | Admitting: Hematology

## 2021-05-20 ENCOUNTER — Other Ambulatory Visit: Payer: Self-pay

## 2021-05-20 ENCOUNTER — Other Ambulatory Visit (HOSPITAL_COMMUNITY): Payer: Self-pay | Admitting: Diagnostic Radiology

## 2021-05-20 DIAGNOSIS — R9389 Abnormal findings on diagnostic imaging of other specified body structures: Secondary | ICD-10-CM

## 2021-05-20 DIAGNOSIS — R928 Other abnormal and inconclusive findings on diagnostic imaging of breast: Secondary | ICD-10-CM | POA: Diagnosis not present

## 2021-05-20 DIAGNOSIS — D241 Benign neoplasm of right breast: Secondary | ICD-10-CM | POA: Diagnosis not present

## 2021-05-20 MED ORDER — GADOBUTROL 1 MMOL/ML IV SOLN
10.0000 mL | Freq: Once | INTRAVENOUS | Status: AC | PRN
  Administered 2021-05-20: 10 mL via INTRAVENOUS

## 2021-06-16 ENCOUNTER — Other Ambulatory Visit: Payer: Self-pay | Admitting: General Surgery

## 2021-06-16 DIAGNOSIS — D241 Benign neoplasm of right breast: Secondary | ICD-10-CM

## 2021-07-21 ENCOUNTER — Encounter (HOSPITAL_BASED_OUTPATIENT_CLINIC_OR_DEPARTMENT_OTHER): Payer: Self-pay | Admitting: General Surgery

## 2021-08-03 ENCOUNTER — Encounter (HOSPITAL_BASED_OUTPATIENT_CLINIC_OR_DEPARTMENT_OTHER)
Admission: RE | Admit: 2021-08-03 | Discharge: 2021-08-03 | Disposition: A | Discharge status: Home / Self Care | Payer: BC Managed Care – PPO | Source: Ambulatory Visit | Attending: General Surgery | Admitting: General Surgery

## 2021-08-03 DIAGNOSIS — R928 Other abnormal and inconclusive findings on diagnostic imaging of breast: Secondary | ICD-10-CM | POA: Diagnosis not present

## 2021-08-03 DIAGNOSIS — Z923 Personal history of irradiation: Secondary | ICD-10-CM | POA: Diagnosis not present

## 2021-08-03 DIAGNOSIS — D241 Benign neoplasm of right breast: Secondary | ICD-10-CM | POA: Diagnosis not present

## 2021-08-03 DIAGNOSIS — Z853 Personal history of malignant neoplasm of breast: Secondary | ICD-10-CM | POA: Diagnosis not present

## 2021-08-03 LAB — BASIC METABOLIC PANEL
Anion gap: 6 (ref 5–15)
BUN: 14 mg/dL (ref 6–20)
CO2: 29 mmol/L (ref 22–32)
Calcium: 9.2 mg/dL (ref 8.9–10.3)
Chloride: 103 mmol/L (ref 98–111)
Creatinine, Ser: 0.75 mg/dL (ref 0.44–1.00)
GFR, Estimated: >60 mL/min (ref >60)
Glucose, Bld: 93 mg/dL (ref 70–99)
Potassium: 4.5 mmol/L (ref 3.5–5.1)
Sodium: 138 mmol/L (ref 135–145)

## 2021-08-03 MED ORDER — ENSURE PRE-SURGERY PO LIQD
296.0000 mL | Freq: Once | ORAL | Status: DC
Start: 2021-08-04 — End: 2021-08-04

## 2021-08-03 NOTE — Progress Notes (Signed)
Sent text reminding pt to come in for lab work.  

## 2021-08-03 NOTE — Progress Notes (Signed)

## 2021-08-04 ENCOUNTER — Ambulatory Visit (HOSPITAL_BASED_OUTPATIENT_CLINIC_OR_DEPARTMENT_OTHER): Payer: BC Managed Care – PPO | Admitting: Certified Registered"

## 2021-08-04 ENCOUNTER — Ambulatory Visit (HOSPITAL_BASED_OUTPATIENT_CLINIC_OR_DEPARTMENT_OTHER)
Admission: RE | Admit: 2021-08-04 | Discharge: 2021-08-04 | Disposition: A | Discharge status: Home / Self Care | Payer: BC Managed Care – PPO | Attending: General Surgery | Admitting: General Surgery

## 2021-08-04 ENCOUNTER — Encounter (HOSPITAL_BASED_OUTPATIENT_CLINIC_OR_DEPARTMENT_OTHER): Payer: Self-pay | Admitting: General Surgery

## 2021-08-04 ENCOUNTER — Other Ambulatory Visit: Payer: Self-pay

## 2021-08-04 ENCOUNTER — Encounter (HOSPITAL_BASED_OUTPATIENT_CLINIC_OR_DEPARTMENT_OTHER)
Admission: RE | Disposition: A | Discharge status: Home / Self Care | Payer: Self-pay | Source: Home / Self Care | Attending: General Surgery

## 2021-08-04 DIAGNOSIS — Z853 Personal history of malignant neoplasm of breast: Secondary | ICD-10-CM | POA: Insufficient documentation

## 2021-08-04 DIAGNOSIS — D241 Benign neoplasm of right breast: Secondary | ICD-10-CM | POA: Insufficient documentation

## 2021-08-04 DIAGNOSIS — I10 Essential (primary) hypertension: Secondary | ICD-10-CM

## 2021-08-04 DIAGNOSIS — N6011 Diffuse cystic mastopathy of right breast: Secondary | ICD-10-CM | POA: Diagnosis not present

## 2021-08-04 DIAGNOSIS — Z923 Personal history of irradiation: Secondary | ICD-10-CM | POA: Diagnosis not present

## 2021-08-04 DIAGNOSIS — R928 Other abnormal and inconclusive findings on diagnostic imaging of breast: Secondary | ICD-10-CM | POA: Diagnosis not present

## 2021-08-04 DIAGNOSIS — N6489 Other specified disorders of breast: Secondary | ICD-10-CM | POA: Diagnosis not present

## 2021-08-04 DIAGNOSIS — N6091 Unspecified benign mammary dysplasia of right breast: Secondary | ICD-10-CM | POA: Diagnosis not present

## 2021-08-04 HISTORY — PX: RADIOACTIVE SEED GUIDED EXCISIONAL BREAST BIOPSY: SHX6490

## 2021-08-04 SURGERY — RADIOACTIVE SEED GUIDED BREAST BIOPSY
Anesthesia: General | Site: Breast | Laterality: Right

## 2021-08-04 MED ORDER — FENTANYL CITRATE (PF) 100 MCG/2ML IJ SOLN
INTRAMUSCULAR | Status: AC
  Filled 2021-08-04: qty 2

## 2021-08-04 MED ORDER — CIPROFLOXACIN IN D5W 400 MG/200ML IV SOLN
INTRAVENOUS | Status: DC | PRN
  Administered 2021-08-04: 400 mg via INTRAVENOUS

## 2021-08-04 MED ORDER — DEXAMETHASONE SODIUM PHOSPHATE 10 MG/ML IJ SOLN
INTRAMUSCULAR | Status: DC | PRN
  Administered 2021-08-04: 5 mg via INTRAVENOUS

## 2021-08-04 MED ORDER — CELECOXIB 200 MG PO CAPS
ORAL_CAPSULE | ORAL | Status: AC
  Filled 2021-08-04: qty 1

## 2021-08-04 MED ORDER — PROPOFOL 10 MG/ML IV BOLUS
INTRAVENOUS | Status: DC | PRN
Start: 2021-08-04 — End: 2021-08-04
  Administered 2021-08-04: 200 mg via INTRAVENOUS

## 2021-08-04 MED ORDER — PHENYLEPHRINE HCL (PRESSORS) 10 MG/ML IV SOLN
INTRAVENOUS | Status: DC | PRN
  Administered 2021-08-04: 80 ug via INTRAVENOUS
  Administered 2021-08-04: 40 ug via INTRAVENOUS
  Administered 2021-08-04: 120 ug via INTRAVENOUS
  Administered 2021-08-04 (×2): 40 ug via INTRAVENOUS

## 2021-08-04 MED ORDER — LIDOCAINE HCL (CARDIAC) PF 100 MG/5ML IV SOSY
PREFILLED_SYRINGE | INTRAVENOUS | Status: DC | PRN
  Administered 2021-08-04: 100 mg via INTRAVENOUS

## 2021-08-04 MED ORDER — CHLORHEXIDINE GLUCONATE CLOTH 2 % EX PADS: 6.0000 | MEDICATED_PAD | Freq: Once | CUTANEOUS | Status: DC

## 2021-08-04 MED ORDER — DEXAMETHASONE SODIUM PHOSPHATE 10 MG/ML IJ SOLN
INTRAMUSCULAR | Status: AC
  Filled 2021-08-04: qty 1

## 2021-08-04 MED ORDER — OXYCODONE HCL 5 MG/5ML PO SOLN: 5.0000 mg | Freq: Once | ORAL | Status: DC | PRN

## 2021-08-04 MED ORDER — OXYCODONE HCL 5 MG PO TABS
5.0000 mg | ORAL_TABLET | Freq: Once | ORAL | Status: DC | PRN
Start: 2021-08-04 — End: 2021-08-04

## 2021-08-04 MED ORDER — LACTATED RINGERS IV SOLN: INTRAVENOUS | Status: DC | PRN

## 2021-08-04 MED ORDER — ACETAMINOPHEN 500 MG PO TABS
1000.0000 mg | ORAL_TABLET | ORAL | Status: AC
  Administered 2021-08-04: 1000 mg via ORAL

## 2021-08-04 MED ORDER — PROPOFOL 10 MG/ML IV BOLUS
INTRAVENOUS | Status: AC
  Filled 2021-08-04: qty 20

## 2021-08-04 MED ORDER — LACTATED RINGERS IV SOLN: INTRAVENOUS | Status: DC

## 2021-08-04 MED ORDER — CELECOXIB 200 MG PO CAPS
400.0000 mg | ORAL_CAPSULE | ORAL | Status: AC
Start: 2021-08-04 — End: 2021-08-04
  Administered 2021-08-04: 400 mg via ORAL

## 2021-08-04 MED ORDER — ACETAMINOPHEN 500 MG PO TABS
ORAL_TABLET | ORAL | Status: AC
  Filled 2021-08-04: qty 2

## 2021-08-04 MED ORDER — CIPROFLOXACIN IN D5W 400 MG/200ML IV SOLN
INTRAVENOUS | Status: AC
  Filled 2021-08-04: qty 200

## 2021-08-04 MED ORDER — MIDAZOLAM HCL 2 MG/2ML IJ SOLN
INTRAMUSCULAR | Status: DC | PRN
  Administered 2021-08-04: 2 mg via INTRAVENOUS

## 2021-08-04 MED ORDER — MIDAZOLAM HCL 2 MG/2ML IJ SOLN
INTRAMUSCULAR | Status: AC
  Filled 2021-08-04: qty 2

## 2021-08-04 MED ORDER — CIPROFLOXACIN IN D5W 400 MG/200ML IV SOLN
400.0000 mg | INTRAVENOUS | Status: DC
Start: 2021-08-04 — End: 2021-08-04

## 2021-08-04 MED ORDER — FENTANYL CITRATE (PF) 100 MCG/2ML IJ SOLN: 25.0000 ug | INTRAMUSCULAR | Status: DC | PRN

## 2021-08-04 MED ORDER — FENTANYL CITRATE (PF) 100 MCG/2ML IJ SOLN
INTRAMUSCULAR | Status: DC | PRN
  Administered 2021-08-04: 50 ug via INTRAVENOUS

## 2021-08-04 MED ORDER — EPHEDRINE SULFATE 50 MG/ML IJ SOLN
INTRAMUSCULAR | Status: DC | PRN
  Administered 2021-08-04: 10 mg via INTRAVENOUS
  Administered 2021-08-04: 5 mg via INTRAVENOUS
  Administered 2021-08-04: 10 mg via INTRAVENOUS

## 2021-08-04 MED ORDER — ONDANSETRON HCL 4 MG/2ML IJ SOLN
INTRAMUSCULAR | Status: AC
  Filled 2021-08-04: qty 2

## 2021-08-04 MED ORDER — PROMETHAZINE HCL 25 MG/ML IJ SOLN: 6.2500 mg | INTRAMUSCULAR | Status: DC | PRN

## 2021-08-04 MED ORDER — LIDOCAINE 2% (20 MG/ML) 5 ML SYRINGE
INTRAMUSCULAR | Status: AC
  Filled 2021-08-04: qty 5

## 2021-08-04 MED ORDER — BUPIVACAINE HCL (PF) 0.25 % IJ SOLN
INTRAMUSCULAR | Status: DC | PRN
  Administered 2021-08-04: 10 mL

## 2021-08-04 SURGICAL SUPPLY — 60 items
ADH SKN CLS APL DERMABOND .7 (GAUZE/BANDAGES/DRESSINGS) ×1
APL PRP STRL LF DISP 70% ISPRP (MISCELLANEOUS) ×1
APPLIER CLIP 9.375 MED OPEN (MISCELLANEOUS) ×2
APR CLP MED 9.3 20 MLT OPN (MISCELLANEOUS) ×1
BINDER BREAST LRG (GAUZE/BANDAGES/DRESSINGS) IMPLANT
BINDER BREAST MEDIUM (GAUZE/BANDAGES/DRESSINGS) IMPLANT
BINDER BREAST XLRG (GAUZE/BANDAGES/DRESSINGS) ×1 IMPLANT
BINDER BREAST XXLRG (GAUZE/BANDAGES/DRESSINGS) IMPLANT
BLADE SURG 15 STRL LF DISP TIS (BLADE) ×1 IMPLANT
BLADE SURG 15 STRL SS (BLADE) ×2
CANISTER SUC SOCK COL 7IN (MISCELLANEOUS) IMPLANT
CANISTER SUCT 1200ML W/VALVE (MISCELLANEOUS) IMPLANT
CHLORAPREP W/TINT 26 (MISCELLANEOUS) ×2 IMPLANT
CLIP APPLIE 9.375 MED OPEN (MISCELLANEOUS) IMPLANT
CLIP TI WIDE RED SMALL 6 (CLIP) IMPLANT
COVER BACK TABLE 60X90IN (DRAPES) ×2 IMPLANT
COVER MAYO STAND STRL (DRAPES) ×2 IMPLANT
COVER PROBE W GEL 5X96 (DRAPES) ×2 IMPLANT
DECANTER SPIKE VIAL GLASS SM (MISCELLANEOUS) IMPLANT
DERMABOND ADVANCED (GAUZE/BANDAGES/DRESSINGS) ×1
DERMABOND ADVANCED .7 DNX12 (GAUZE/BANDAGES/DRESSINGS) ×1 IMPLANT
DRAPE LAPAROSCOPIC ABDOMINAL (DRAPES) ×2 IMPLANT
DRAPE UTILITY XL STRL (DRAPES) ×2 IMPLANT
DRSG TEGADERM 4X4.75 (GAUZE/BANDAGES/DRESSINGS) IMPLANT
ELECT COATED BLADE 2.86 ST (ELECTRODE) ×2 IMPLANT
ELECT REM PT RETURN 9FT ADLT (ELECTROSURGICAL) ×2
ELECTRODE REM PT RTRN 9FT ADLT (ELECTROSURGICAL) ×1 IMPLANT
GAUZE SPONGE 4X4 12PLY STRL LF (GAUZE/BANDAGES/DRESSINGS) IMPLANT
GLOVE SURG ENC MOIS LTX SZ6 (GLOVE) ×1 IMPLANT
GLOVE SURG ENC MOIS LTX SZ7 (GLOVE) ×4 IMPLANT
GLOVE SURG UNDER POLY LF SZ6.5 (GLOVE) ×1 IMPLANT
GLOVE SURG UNDER POLY LF SZ7 (GLOVE) ×2 IMPLANT
GLOVE SURG UNDER POLY LF SZ7.5 (GLOVE) ×2 IMPLANT
GOWN STRL REUS W/ TWL LRG LVL3 (GOWN DISPOSABLE) ×2 IMPLANT
GOWN STRL REUS W/ TWL XL LVL3 (GOWN DISPOSABLE) IMPLANT
GOWN STRL REUS W/TWL LRG LVL3 (GOWN DISPOSABLE) ×4
GOWN STRL REUS W/TWL XL LVL3 (GOWN DISPOSABLE) ×4
HEMOSTAT ARISTA ABSORB 3G PWDR (HEMOSTASIS) IMPLANT
KIT MARKER MARGIN INK (KITS) ×2 IMPLANT
NDL HYPO 25X1 1.5 SAFETY (NEEDLE) ×1 IMPLANT
NEEDLE HYPO 25X1 1.5 SAFETY (NEEDLE) ×2 IMPLANT
NS IRRIG 1000ML POUR BTL (IV SOLUTION) ×1 IMPLANT
PACK BASIN DAY SURGERY FS (CUSTOM PROCEDURE TRAY) ×2 IMPLANT
PENCIL SMOKE EVACUATOR (MISCELLANEOUS) ×2 IMPLANT
RETRACTOR ONETRAX LX 90X20 (MISCELLANEOUS) IMPLANT
SLEEVE SCD COMPRESS KNEE MED (STOCKING) ×2 IMPLANT
SPONGE T-LAP 4X18 ~~LOC~~+RFID (SPONGE) ×2 IMPLANT
STRIP CLOSURE SKIN 1/2X4 (GAUZE/BANDAGES/DRESSINGS) ×2 IMPLANT
SUT MNCRL AB 4-0 PS2 18 (SUTURE) IMPLANT
SUT MON AB 5-0 PS2 18 (SUTURE) ×1 IMPLANT
SUT SILK 2 0 SH (SUTURE) IMPLANT
SUT VIC AB 2-0 SH 27 (SUTURE) ×2
SUT VIC AB 2-0 SH 27XBRD (SUTURE) ×1 IMPLANT
SUT VIC AB 3-0 SH 27 (SUTURE) ×2
SUT VIC AB 3-0 SH 27X BRD (SUTURE) ×1 IMPLANT
SYR CONTROL 10ML LL (SYRINGE) ×2 IMPLANT
TOWEL GREEN STERILE FF (TOWEL DISPOSABLE) ×2 IMPLANT
TRAY FAXITRON CT DISP (TRAY / TRAY PROCEDURE) ×2 IMPLANT
TUBE CONNECTING 20X1/4 (TUBING) IMPLANT
YANKAUER SUCT BULB TIP NO VENT (SUCTIONS) IMPLANT

## 2021-08-04 NOTE — Discharge Instructions (Addendum)
[place] Office Phone Number [phone]  POST OP INSTRUCTIONS Take 400 mg of ibuprofen every 8 hours or 650 mg tylenol every 6 hours for next 72 hours then as needed. Use ice several times daily also. Always review your discharge instruction sheet given to you by the facility where your surgery was performed.  IF YOU HAVE DISABILITY OR FAMILY LEAVE FORMS, YOU MUST BRING THEM TO THE OFFICE FOR PROCESSING.  DO NOT GIVE THEM TO YOUR DOCTOR.  A prescription for pain medication may be given to you upon discharge.  Take your pain medication as prescribed, if needed.  If narcotic pain medicine is not needed, then you may take acetaminophen (Tylenol), naprosyn (Alleve) or ibuprofen (Advil) as needed. Take your usually prescribed medications unless otherwise directed If you need a refill on your pain medication, please contact your pharmacy.  They will contact our office to request authorization.  Prescriptions will not be filled after 5pm or on week-ends. You should eat very light the first 24 hours after surgery, such as soup, crackers, pudding, etc.  Resume your normal diet the day after surgery. Most patients will experience some swelling and bruising in the breast.  Ice packs and a good support bra will help.  Wear the breast binder provided or a sports bra for 72 hours day and night.  After that wear a sports bra during the day until you return to the office. Swelling and bruising can take several days to resolve.  It is common to experience some constipation if taking pain medication after surgery.  Increasing fluid intake and taking a stool softener will usually help or prevent this problem from occurring.  A mild laxative (Milk of Magnesia or Miralax) should be taken according to package directions if there are no bowel movements after 48 hours. Unless discharge instructions indicate otherwise, you may remove your bandages 48 hours after surgery and you may shower at that time.   You may have steri-strips (small skin tapes) in place directly over the incision.  These strips should be left on the skin for 7-10 days and will come off on their own.  If your surgeon used skin glue on the incision, you may shower in 24 hours.  The glue will flake off over the next 2-3 weeks.  Any sutures or staples will be removed at the office during your follow-up visit. ACTIVITIES:  You may resume regular daily activities (gradually increasing) beginning the next day.  Wearing a good support bra or sports bra minimizes pain and swelling.  You may have sexual intercourse when it is comfortable. You may drive when you no longer are taking prescription pain medication, you can comfortably wear a seatbelt, and you can safely maneuver your car and apply brakes. RETURN TO WORK:  ______________________________________________________________________________________ Dennis Bast should see your doctor in the office for a follow-up appointment approximately two weeks after your surgery.  Your doctors nurse will typically make your follow-up appointment when she calls you with your pathology report.  Expect your pathology report 3-4 business days after your surgery.  You may call to check if you do not hear from [place] after three days. OTHER INSTRUCTIONS: _______________________________________________________________________________________________ _____________________________________________________________________________________________________________________________________ _____________________________________________________________________________________________________________________________________ _____________________________________________________________________________________________________________________________________  WHEN TO CALL DR WAKEFIELD: Fever over 101.0 Nausea and/or vomiting. Extreme swelling or bruising. Continued bleeding from incision. Increased pain, redness, or drainage from  the incision.  The clinic staff is available to answer your questions during regular business hours.  Please dont hesitate to call and ask to speak  to one of the nurses for clinical concerns.  If you have a medical emergency, go to the nearest emergency room or call 911.  A surgeon from [facility] is always on call at the hospital.  For further questions, please visit [email or web address]     Post Anesthesia Home Care Instructions  Activity: Get plenty of rest for the remainder of the day. A responsible individual must stay with you for 24 hours following the procedure.  For the next 24 hours, DO NOT: -Drive a car -Paediatric nurse -Drink alcoholic beverages -Take any medication unless instructed by your physician -Make any legal decisions or sign important papers.  Meals: Start with liquid foods such as gelatin or soup. Progress to regular foods as tolerated. Avoid greasy, spicy, heavy foods. If nausea and/or vomiting occur, drink only clear liquids until the nausea and/or vomiting subsides. Call your physician if vomiting continues.  Special Instructions/Symptoms: Your throat may feel dry or sore from the anesthesia or the breathing tube placed in your throat during surgery. If this causes discomfort, gargle with warm salt water. The discomfort should disappear within 24 hours.  If you had a scopolamine patch placed behind your ear for the management of post- operative nausea and/or vomiting:  1. The medication in the patch is effective for 72 hours, after which it should be removed.  Wrap patch in a tissue and discard in the trash. Wash hands thoroughly with soap and water. 2. You may remove the patch earlier than 72 hours if you experience unpleasant side effects which may include dry mouth, dizziness or visual disturbances. 3. Avoid touching the patch. Wash your hands with soap and water after contact with the patch.   No tylenol or ibuprofen today until  after 12:45 if needed.

## 2021-08-04 NOTE — Anesthesia Procedure Notes (Signed)
Procedure Name: LMA Insertion Date/Time: 08/04/2021 8:26 AM Performed by: Verita Lamb, CRNA Pre-anesthesia Checklist: Patient identified, Emergency Drugs available, Suction available and Patient being monitored Patient Re-evaluated:Patient Re-evaluated prior to induction Oxygen Delivery Method: Circle system utilized Preoxygenation: Pre-oxygenation with 100% oxygen Induction Type: IV induction Ventilation: Mask ventilation without difficulty LMA: LMA inserted LMA Size: 4.0 Number of attempts: 1 Airway Equipment and Method: Bite block Placement Confirmation: positive ETCO2 Tube secured with: Tape Dental Injury: Teeth and Oropharynx as per pre-operative assessment

## 2021-08-04 NOTE — Anesthesia Postprocedure Evaluation (Signed)
Anesthesia Post Note  Patient: Lori Palmer  Procedure(s) Performed: RIGHT BREAST SEED GUIDED EXCISIONAL BIOPSY (Right: Breast)     Patient location during evaluation: PACU Anesthesia Type: General Level of consciousness: awake and alert and oriented Pain management: pain level controlled Vital Signs Assessment: post-procedure vital signs reviewed and stable Respiratory status: spontaneous breathing, nonlabored ventilation and respiratory function stable Cardiovascular status: blood pressure returned to baseline Postop Assessment: no apparent nausea or vomiting Anesthetic complications: no   No notable events documented.  Last Vitals:  Vitals:   08/04/21 0845 08/04/21 0914  BP: 104/73 121/77  Pulse: 78 73  Resp: 15 16  Temp:  36.6 C  SpO2: 95% 95%    Last Pain:  Vitals:   08/04/21 0914  TempSrc:   PainSc: 0-No pain                 Marthenia Rolling

## 2021-08-04 NOTE — Op Note (Addendum)
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  She underwent radiotherapy.  She decided with oncology to pursue staggered mammogram and MRI.  She had an MRI that showed suspicious focal non-mass enhancement in the lower inner quadrant of the right breast measuring 1 cm.  This underwent a biopsy that was an intraductal papilloma with usual ductal hyperplasia.  We discussed her options and elected to proceed with an excision.  Procedure: After informed consent was obtained the patient was taken to the operating room.  She had previously had a seed placed.  I had these mammograms available in the operating room.  She was given antibiotics.  SCDs were in place.  She was placed under general anesthesia with an LMA without complication.  She was prepped and draped in the standard sterile surgical fashion.  A surgical timeout was then performed.  I located the seed in the inferior periareolar area.  I injected Marcaine throughout this area.  I then made a periareolar incision in order to hide the scar later.  I dissected to the seed.  I used cautery and the neoprobe to guide removal of the seed and some of the surrounding tissue.  I confirmed removal of the seed and the clip with mammography.  I then obtained hemostasis.  The breast tissue was closed with 2-0 Vicryl.  The skin was closed with 3-0 Vicryl and 5-0 Monocryl.  Glue and a Steri-Strip were applied.  She tolerated this well was extubated and transferred to recovery in stable condition.

## 2021-08-04 NOTE — Interval H&P Note (Signed)
History and Physical Interval Note:  08/04/2021 7:10 AM  Lori Palmer  has presented today for surgery, with the diagnosis of RIGHT BREAST MASS.  The various methods of treatment have been discussed with the patient and family. After consideration of risks, benefits and other options for treatment, the patient has consented to  Procedure(s): RIGHT BREAST SEED GUIDED EXCISIONAL BIOPSY (Right) as a surgical intervention.  The patient's history has been reviewed, patient examined, no change in status, stable for surgery.  I have reviewed the patient's chart and labs.  Questions were answered to the patient's satisfaction.     Rolm Bookbinder

## 2021-08-04 NOTE — Transfer of Care (Signed)
Immediate Anesthesia Transfer of Care Note  Patient: Lori Palmer  Procedure(s) Performed: RIGHT BREAST SEED GUIDED EXCISIONAL BIOPSY (Right: Breast)  Patient Location: PACU  Anesthesia Type:General  Level of Consciousness: awake, alert  and oriented  Airway & Oxygen Therapy: Patient Spontanous Breathing and Patient connected to face mask oxygen  Post-op Assessment: Report given to RN and Post -op Vital signs reviewed and stable  Post vital signs: Reviewed and stable  Last Vitals:  Vitals Value Taken Time  BP    Temp    Pulse    Resp    SpO2      Last Pain:  Vitals:   08/04/21 0652  TempSrc: Oral  PainSc: 0-No pain      Patients Stated Pain Goal: 4 (19/37/90 2409)  Complications: No notable events documented.

## 2021-08-04 NOTE — Addendum Note (Signed)
Addendum  created 08/04/21 1120 by Verita Lamb, CRNA   Charge Capture section accepted

## 2021-08-04 NOTE — Anesthesia Preprocedure Evaluation (Addendum)
Anesthesia Evaluation  Patient identified by MRN, date of birth, ID band Patient awake    Reviewed: Allergy & Precautions, NPO status , Patient's Chart, lab work & pertinent test results, reviewed documented beta blocker date and time   History of Anesthesia Complications Negative for: history of anesthetic complications  Airway Mallampati: II  TM Distance: >3 FB Neck ROM: Full    Dental no notable dental hx.    Pulmonary sleep apnea ,    Pulmonary exam normal        Cardiovascular hypertension, Pt. on medications and Pt. on home beta blockers Normal cardiovascular exam     Neuro/Psych negative neurological ROS  negative psych ROS   GI/Hepatic Neg liver ROS, UC   Endo/Other  negative endocrine ROS  Renal/GU negative Renal ROS  negative genitourinary   Musculoskeletal negative musculoskeletal ROS (+)   Abdominal   Peds  Hematology negative hematology ROS (+)   Anesthesia Other Findings Day of surgery medications reviewed with patient.  Reproductive/Obstetrics negative OB ROS                            Anesthesia Physical Anesthesia Plan  ASA: 2  Anesthesia Plan: General   Post-op Pain Management: Tylenol PO (pre-op)   Induction: Intravenous  PONV Risk Score and Plan: 3 and Treatment may vary due to age or medical condition, Ondansetron, Dexamethasone and Midazolam  Airway Management Planned: LMA  Additional Equipment: None  Intra-op Plan:   Post-operative Plan: Extubation in OR  Informed Consent: I have reviewed the patients History and Physical, chart, labs and discussed the procedure including the risks, benefits and alternatives for the proposed anesthesia with the patient or authorized representative who has indicated his/her understanding and acceptance.     Dental advisory given  Plan Discussed with: CRNA  Anesthesia Plan Comments:        Anesthesia Quick  Evaluation

## 2021-08-04 NOTE — H&P (Signed)
°  61 y.o. female who is seen today for right breast mri finding with biopsy of papilloma. She has a recent in May lumpectomy for high-grade DCIS that was ER/PR negative. This was followed by radiotherapy. She decided with oncology to then pursue staggered mammogram and MRI. She has no mass or discharge and has no complaints at all referable to either breast. She underwent the MRI that shows suspicious focal non-mass enhancement within the lower inner quadrant of the right breast measuring 1 cm. The left breast is negative the nodes are all negative. This underwent a biopsy and shows this to be an intraductal papilloma with usual ductal hyperplasia. She is here with her husband to discuss her options today.  Review of Systems: A complete review of systems was obtained from the patient. I have reviewed this information and discussed as appropriate with the patient. See HPI as well for other ROS.  Review of Systems  All other systems reviewed and are negative.   Medical History: Past Medical History:  Diagnosis Date   History of cancer   Liver disease   There is no problem list on file for this patient.  Past Surgical History:  Procedure Laterality Date   MASTECTOMY PARTIAL / LUMPECTOMY Left    Allergies  Allergen Reactions   Penicillins Hives   Sulfamethoxazole-Trimethoprim Other (See Comments)   Codeine Hives   Current Outpatient Medications on File Prior to Visit  Medication Sig Dispense Refill   carvediloL (COREG) 12.5 MG tablet 1 tablet (12.5 mg total)   No current facility-administered medications on file prior to visit.   Family History  Problem Relation Age of Onset   Coronary Artery Disease (Blocked arteries around heart) Father    Social History   Tobacco Use  Smoking Status Never  Smokeless Tobacco Never    Social History   Socioeconomic History   Marital status: Unknown  Tobacco Use   Smoking status: Never   Smokeless tobacco: Never  Substance and Sexual  Activity   Alcohol use: Never   Drug use: Never   Objective:   Vitals:  06/16/21 1011  BP: 122/78  Pulse: 101  Temp: 36.7 C (98.1 F)  SpO2: 98%  Weight: 94.8 kg (209 lb)  Height: 172.7 cm (5\' 8" )   Body mass index is 31.78 kg/m.  Physical Exam Constitutional:  Appearance: Normal appearance.  Chest:  Breasts: Right: No inverted nipple, mass or nipple discharge.  Left: No inverted nipple, mass or nipple discharge.   Lymphadenopathy:  Upper Body:  Right upper body: No supraclavicular or axillary adenopathy.  Left upper body: No supraclavicular or axillary adenopathy.  Neurological:  Mental Status: She is alert.     Assessment and Plan:  Diagnoses and all orders for this visit:  Intraductal papilloma of breast, right  Right breast radioactive seed guided excisional biopsy  We discussed the MRI. I think long-term I do not really think she is going to need a combination of MRI and mammograms and I told her we would discuss that again after this procedure. Unfortunately I think with her history and the MRI finding I think this does need to be excised. We discussed the rationale for that. We discussed a radioactive seed guided excisional biopsy which is similar to the surgery she just had not long ago for the DCIS on the other side. Were to schedule this soon.

## 2021-08-05 ENCOUNTER — Encounter (HOSPITAL_BASED_OUTPATIENT_CLINIC_OR_DEPARTMENT_OTHER): Payer: Self-pay | Admitting: General Surgery

## 2021-08-08 LAB — SURGICAL PATHOLOGY

## 2021-10-05 ENCOUNTER — Encounter (HOSPITAL_COMMUNITY): Payer: Self-pay

## 2021-10-10 DIAGNOSIS — Z13220 Encounter for screening for lipoid disorders: Secondary | ICD-10-CM | POA: Diagnosis not present

## 2021-10-10 DIAGNOSIS — K5190 Ulcerative colitis, unspecified, without complications: Secondary | ICD-10-CM | POA: Diagnosis not present

## 2021-10-10 DIAGNOSIS — J4520 Mild intermittent asthma, uncomplicated: Secondary | ICD-10-CM | POA: Diagnosis not present

## 2021-10-10 DIAGNOSIS — Z0000 Encounter for general adult medical examination without abnormal findings: Secondary | ICD-10-CM | POA: Diagnosis not present

## 2021-10-10 DIAGNOSIS — I10 Essential (primary) hypertension: Secondary | ICD-10-CM | POA: Diagnosis not present

## 2021-10-10 DIAGNOSIS — C50912 Malignant neoplasm of unspecified site of left female breast: Secondary | ICD-10-CM | POA: Diagnosis not present

## 2021-11-02 ENCOUNTER — Encounter (HOSPITAL_COMMUNITY): Payer: Self-pay

## 2021-11-18 DIAGNOSIS — D485 Neoplasm of uncertain behavior of skin: Secondary | ICD-10-CM | POA: Diagnosis not present

## 2021-12-21 DIAGNOSIS — R922 Inconclusive mammogram: Secondary | ICD-10-CM | POA: Diagnosis not present

## 2021-12-21 DIAGNOSIS — Z853 Personal history of malignant neoplasm of breast: Secondary | ICD-10-CM | POA: Diagnosis not present

## 2022-02-22 DIAGNOSIS — L910 Hypertrophic scar: Secondary | ICD-10-CM | POA: Diagnosis not present

## 2022-02-22 DIAGNOSIS — D485 Neoplasm of uncertain behavior of skin: Secondary | ICD-10-CM | POA: Diagnosis not present

## 2022-02-22 DIAGNOSIS — L820 Inflamed seborrheic keratosis: Secondary | ICD-10-CM | POA: Diagnosis not present

## 2022-04-03 IMAGING — MR MR BREAST BX W/ LOC DEV 1ST LEASION IMAGE BX SPEC MR GUIDE*R*
6 of 8 series · 32 of 48 positions shown · IV contrast (10 ML GADAVIST)
Comparison: Previous exams.
COMPARISON: Previous exams.

Addendum:
CLINICAL DATA: 60-year-old female for tissue sampling of 1 cm non
masslike enhancement within the LOWER INNER RIGHT breast.

EXAM:
MRI GUIDED CORE NEEDLE BIOPSY OF THE RIGHT BREAST
TECHNIQUE: Multiplanar, multisequence MR imaging of the RIGHT breast was
performed both before and after administration of intravenous
contrast.
CONTRAST:  10mL GADAVIST GADOBUTROL 1 MMOL/ML IV SOLN

[Series 2: fiducial unilateral · sagittal · 2.0mm · 1.33mm/px · 1 of 52 slices shown]
[im 1/52]
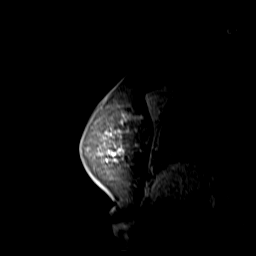

[Series 3: dynamic pre · axial · non-contrast · 1.3mm · 0.73mm/px · z∈[-84,+123]mm · 6 of 160 slices shown]
[im 1/160]
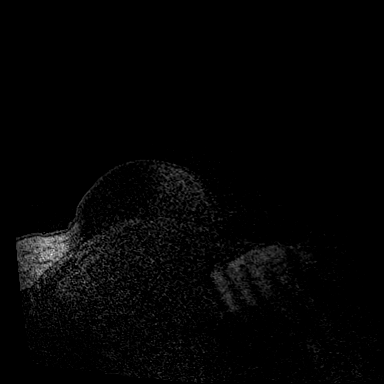
[im 32/160]
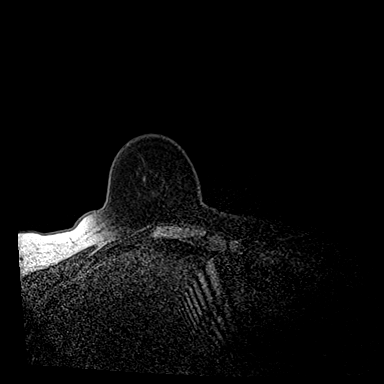
[im 64/160]
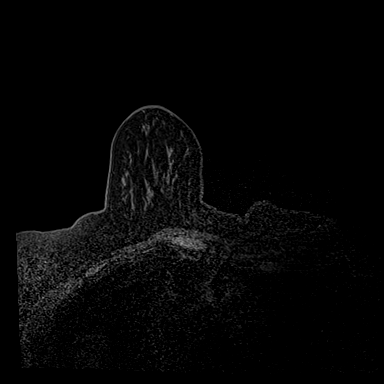
[im 96/160]
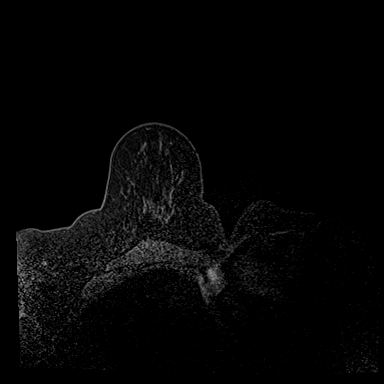
[im 128/160]
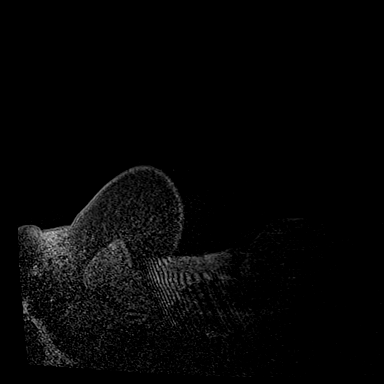
[im 160/160]
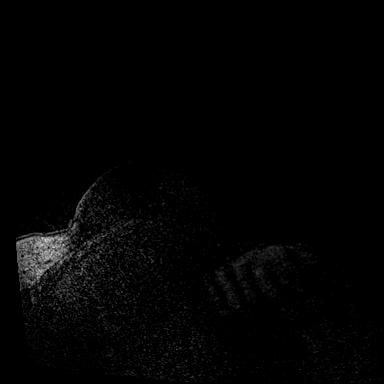

[Series 4: dynamic post 20 · axial · 1.3mm · 0.73mm/px · z∈[-84,+123]mm · 6 of 160 slices shown (1 of 2)]
[im 1/160]
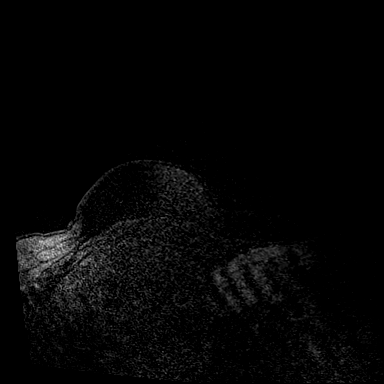
[im 32/160]
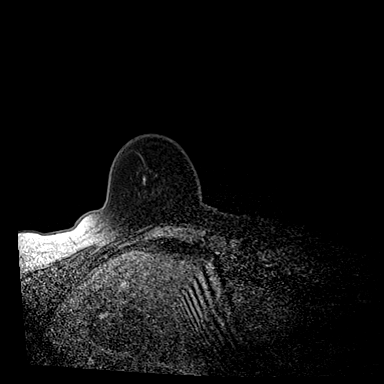
[im 64/160]
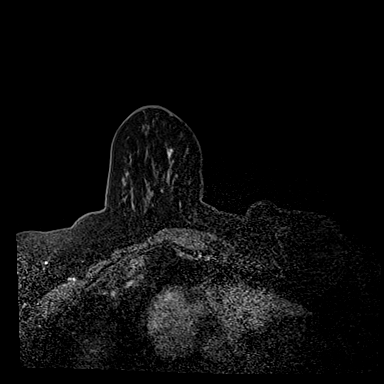
[im 96/160]
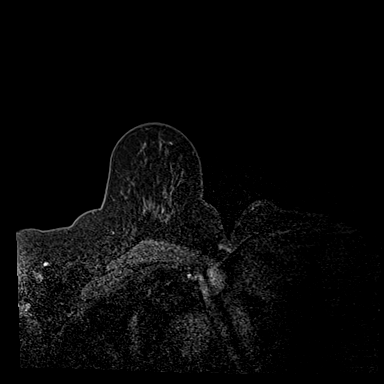
[im 128/160]
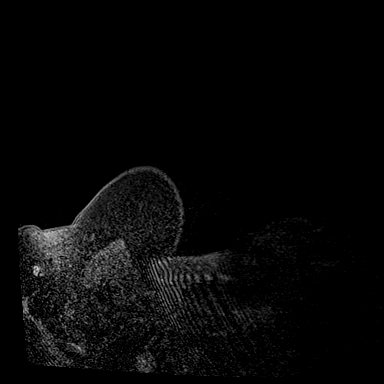
[im 160/160]
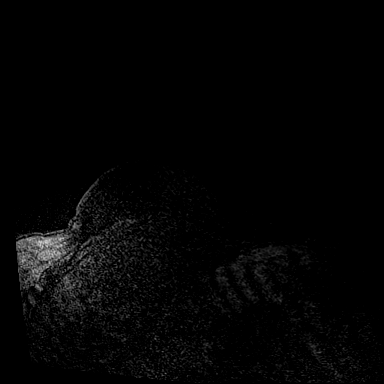

[Series 5: dynamic post 20 · axial · 1.3mm · 0.73mm/px · z∈[-84,+123]mm · 7 of 160 slices shown (2 of 2)]
[im 1/160]
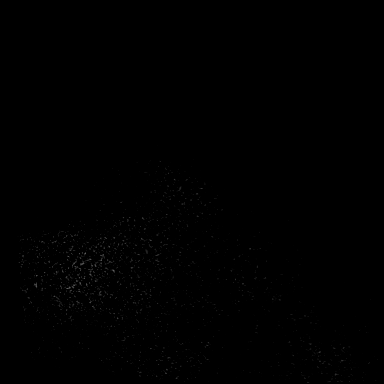
[im 27/160]
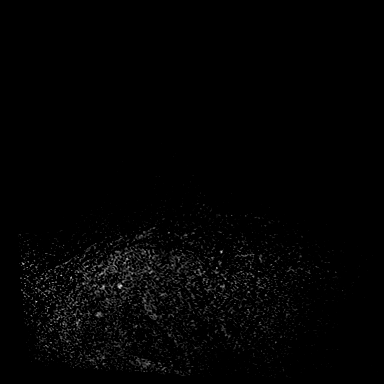
[im 54/160]
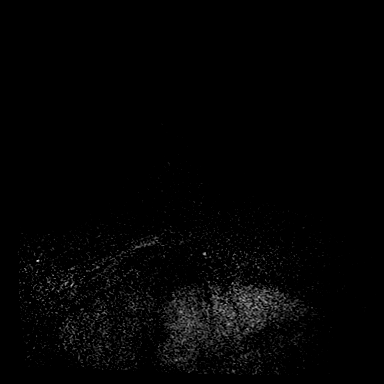
[im 80/160]
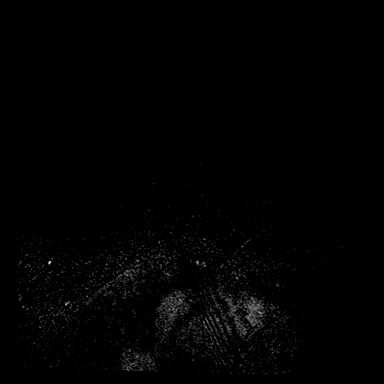
[im 107/160]
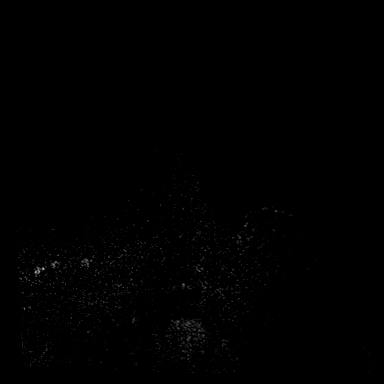
[im 133/160]
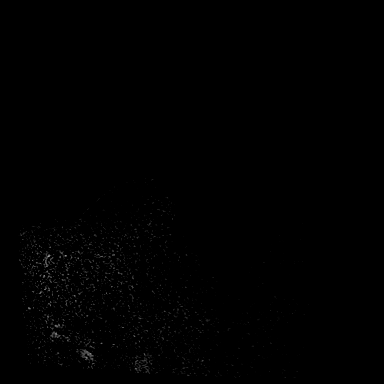
[im 160/160]
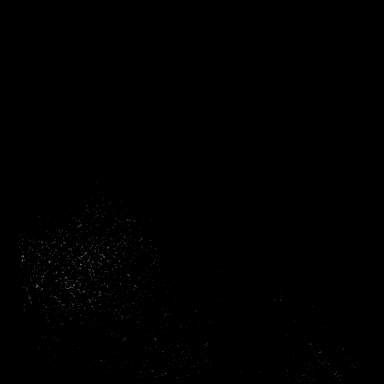

[Series 6: needle confirmation · axial · 1.3mm · 0.73mm/px · z∈[-84,+123]mm · 7 of 160 slices shown]
[im 1/160]
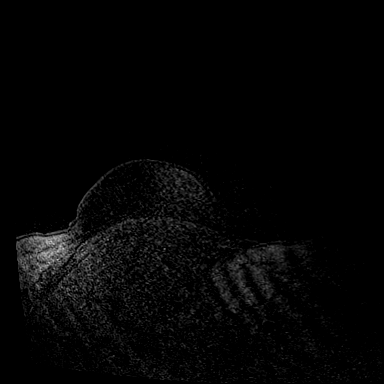
[im 27/160]
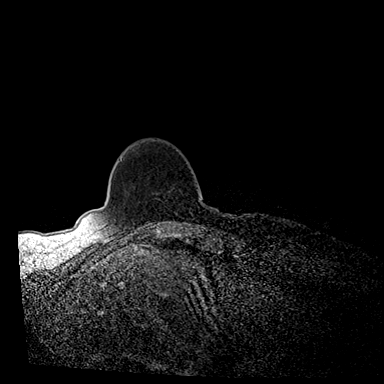
[im 54/160]
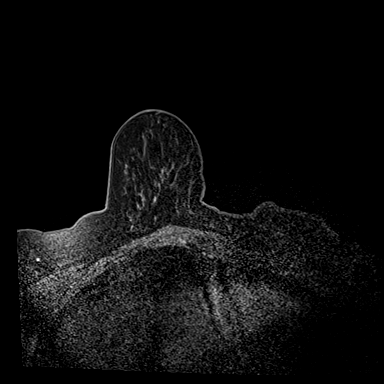
[im 80/160]
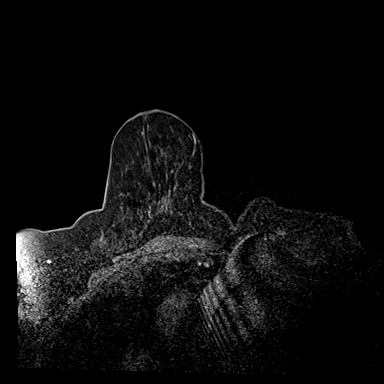
[im 107/160]
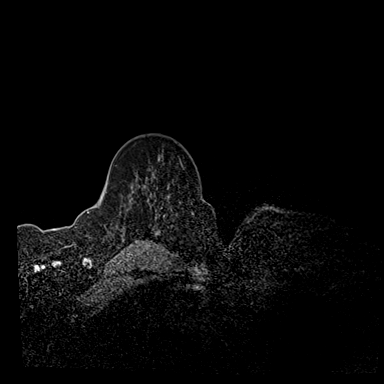
[im 133/160]
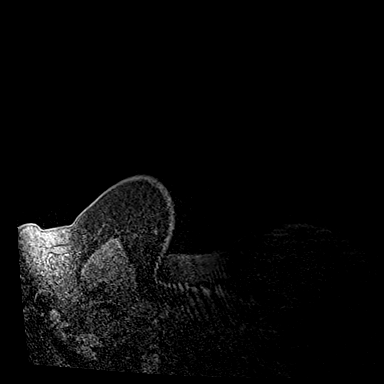
[im 160/160]
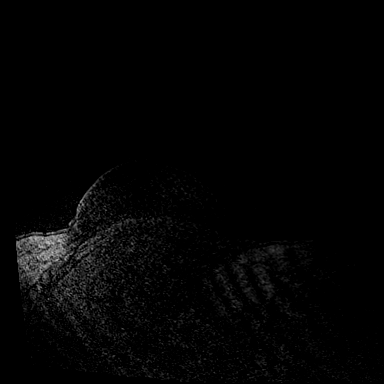

[Series 7: needle confirmation_sub · axial · 1.3mm · 0.73mm/px · z∈[-84,+54]mm · 5 of 160 slices shown]
[im 1/160]
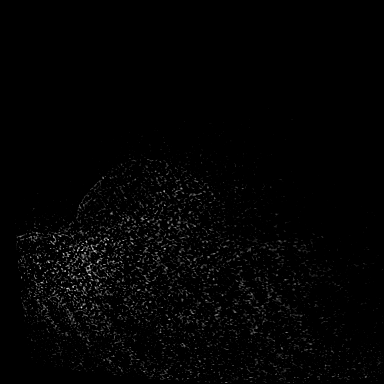
[im 27/160]
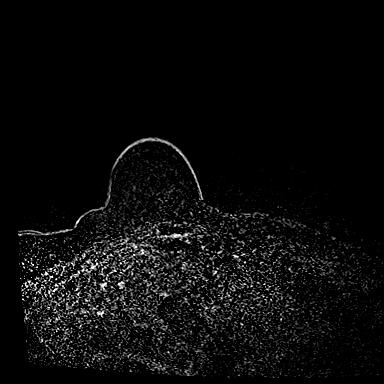
[im 54/160]
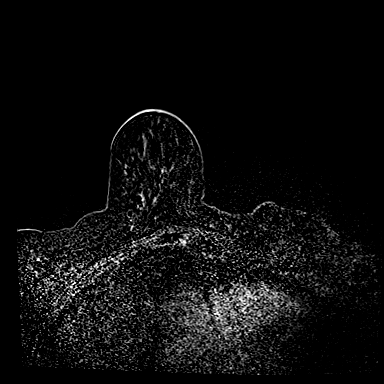
[im 80/160]
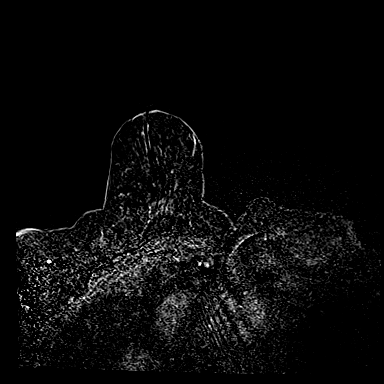
[im 107/160]
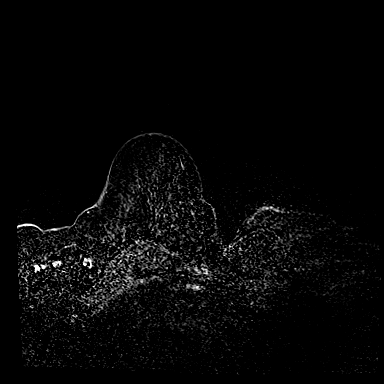

[32 of 48 positions shown; findings below may reference images not displayed]

FINDINGS: I met with the patient, and we discussed the procedure of MRI guided
biopsy, including risks, benefits, and alternatives. Specifically,
we discussed the risks of infection, bleeding, tissue injury, clip
migration, and inadequate sampling. Informed, written consent was
given. The usual time out protocol was performed immediately prior
to the procedure.

Using sterile technique, 1% Lidocaine, MRI guidance, and a 9 gauge
vacuum assisted device, biopsy was performed of the 1 cm focal non
masslike enhancement within the LOWER INNER RIGHT breast using a
MEDIAL approach. At the conclusion of the procedure, a CYLINDER
tissue marker clip was deployed into the biopsy cavity. Follow-up
2-view mammogram was performed and dictated separately.
IMPRESSION: MRI guided biopsy of 1 cm focal non masslike enhancement within the
LOWER INNER RIGHT breast. No apparent complications.

ADDENDUM:
Pathology revealed INTRADUCTAL PAPILLOMA WITH USUAL DUCTAL
HYPERPLASIA, NO ATYPIA OR MALIGNANCY IDENTIFIED of the RIGHT breast,
lower inner, (cylinder clip). This was found to be concordant by Dr.
Lizett Pound, with surgical consultation for consideration of excision
recommended.

Pathology results were discussed with the patient by telephone. The
patient reported doing well after the biopsy with tenderness,
bleeding and bruising at the site. Post biopsy instructions and care
were reviewed and questions were answered. The patient was
encouraged to call The [[facility]] for any
additional concerns. My direct phone number was provided.

Surgical consultation has been arranged with Dr. Micio Tateo
at [[facility]] on June 16, 2021.

Pathology results reported by Xiiray Tiessen, RN on 05/25/2021.

*** End of Addendum ***
FINDINGS: I met with the patient, and we discussed the procedure of MRI guided
biopsy, including risks, benefits, and alternatives. Specifically,
we discussed the risks of infection, bleeding, tissue injury, clip
migration, and inadequate sampling. Informed, written consent was
given. The usual time out protocol was performed immediately prior
to the procedure.

Using sterile technique, 1% Lidocaine, MRI guidance, and a 9 gauge
vacuum assisted device, biopsy was performed of the 1 cm focal non
masslike enhancement within the LOWER INNER RIGHT breast using a
MEDIAL approach. At the conclusion of the procedure, a CYLINDER
tissue marker clip was deployed into the biopsy cavity. Follow-up
2-view mammogram was performed and dictated separately.
IMPRESSION: MRI guided biopsy of 1 cm focal non masslike enhancement within the
LOWER INNER RIGHT breast. No apparent complications.

## 2022-04-12 DIAGNOSIS — I10 Essential (primary) hypertension: Secondary | ICD-10-CM | POA: Diagnosis not present

## 2022-04-12 DIAGNOSIS — F439 Reaction to severe stress, unspecified: Secondary | ICD-10-CM | POA: Diagnosis not present

## 2022-04-12 DIAGNOSIS — K5190 Ulcerative colitis, unspecified, without complications: Secondary | ICD-10-CM | POA: Diagnosis not present

## 2022-04-12 DIAGNOSIS — J4520 Mild intermittent asthma, uncomplicated: Secondary | ICD-10-CM | POA: Diagnosis not present

## 2022-05-03 DIAGNOSIS — C44712 Basal cell carcinoma of skin of right lower limb, including hip: Secondary | ICD-10-CM | POA: Diagnosis not present

## 2022-08-29 DIAGNOSIS — L820 Inflamed seborrheic keratosis: Secondary | ICD-10-CM | POA: Diagnosis not present

## 2022-08-29 DIAGNOSIS — L219 Seborrheic dermatitis, unspecified: Secondary | ICD-10-CM | POA: Diagnosis not present

## 2022-11-14 DIAGNOSIS — I10 Essential (primary) hypertension: Secondary | ICD-10-CM | POA: Diagnosis not present

## 2022-11-14 DIAGNOSIS — K5190 Ulcerative colitis, unspecified, without complications: Secondary | ICD-10-CM | POA: Diagnosis not present

## 2022-11-14 DIAGNOSIS — Z0000 Encounter for general adult medical examination without abnormal findings: Secondary | ICD-10-CM | POA: Diagnosis not present

## 2022-11-14 DIAGNOSIS — F439 Reaction to severe stress, unspecified: Secondary | ICD-10-CM | POA: Diagnosis not present

## 2022-11-14 DIAGNOSIS — Z1159 Encounter for screening for other viral diseases: Secondary | ICD-10-CM | POA: Diagnosis not present

## 2022-11-14 DIAGNOSIS — Z13220 Encounter for screening for lipoid disorders: Secondary | ICD-10-CM | POA: Diagnosis not present

## 2022-11-14 DIAGNOSIS — J309 Allergic rhinitis, unspecified: Secondary | ICD-10-CM | POA: Diagnosis not present

## 2022-12-26 DIAGNOSIS — C50912 Malignant neoplasm of unspecified site of left female breast: Secondary | ICD-10-CM | POA: Diagnosis not present

## 2023-05-16 DIAGNOSIS — I10 Essential (primary) hypertension: Secondary | ICD-10-CM | POA: Diagnosis not present

## 2023-05-16 DIAGNOSIS — K5190 Ulcerative colitis, unspecified, without complications: Secondary | ICD-10-CM | POA: Diagnosis not present

## 2023-05-16 DIAGNOSIS — J4520 Mild intermittent asthma, uncomplicated: Secondary | ICD-10-CM | POA: Diagnosis not present

## 2023-05-16 DIAGNOSIS — J309 Allergic rhinitis, unspecified: Secondary | ICD-10-CM | POA: Diagnosis not present

## 2023-05-28 DIAGNOSIS — K5289 Other specified noninfective gastroenteritis and colitis: Secondary | ICD-10-CM | POA: Diagnosis not present

## 2023-05-28 DIAGNOSIS — K5150 Left sided colitis without complications: Secondary | ICD-10-CM | POA: Diagnosis not present

## 2023-08-01 DIAGNOSIS — L820 Inflamed seborrheic keratosis: Secondary | ICD-10-CM | POA: Diagnosis not present

## 2023-08-01 DIAGNOSIS — L739 Follicular disorder, unspecified: Secondary | ICD-10-CM | POA: Diagnosis not present

## 2023-12-11 DIAGNOSIS — J4520 Mild intermittent asthma, uncomplicated: Secondary | ICD-10-CM | POA: Diagnosis not present

## 2023-12-11 DIAGNOSIS — Z13220 Encounter for screening for lipoid disorders: Secondary | ICD-10-CM | POA: Diagnosis not present

## 2023-12-11 DIAGNOSIS — I10 Essential (primary) hypertension: Secondary | ICD-10-CM | POA: Diagnosis not present

## 2023-12-11 DIAGNOSIS — N6325 Unspecified lump in the left breast, overlapping quadrants: Secondary | ICD-10-CM | POA: Diagnosis not present

## 2023-12-11 DIAGNOSIS — Z0000 Encounter for general adult medical examination without abnormal findings: Secondary | ICD-10-CM | POA: Diagnosis not present

## 2023-12-11 DIAGNOSIS — K5190 Ulcerative colitis, unspecified, without complications: Secondary | ICD-10-CM | POA: Diagnosis not present

## 2023-12-11 DIAGNOSIS — Z23 Encounter for immunization: Secondary | ICD-10-CM | POA: Diagnosis not present

## 2023-12-28 DIAGNOSIS — R92333 Mammographic heterogeneous density, bilateral breasts: Secondary | ICD-10-CM | POA: Diagnosis not present

## 2023-12-28 DIAGNOSIS — N641 Fat necrosis of breast: Secondary | ICD-10-CM | POA: Diagnosis not present

## 2023-12-28 DIAGNOSIS — N6489 Other specified disorders of breast: Secondary | ICD-10-CM | POA: Diagnosis not present

## 2024-06-12 DIAGNOSIS — J4520 Mild intermittent asthma, uncomplicated: Secondary | ICD-10-CM | POA: Diagnosis not present

## 2024-06-12 DIAGNOSIS — K5190 Ulcerative colitis, unspecified, without complications: Secondary | ICD-10-CM | POA: Diagnosis not present

## 2024-06-12 DIAGNOSIS — I10 Essential (primary) hypertension: Secondary | ICD-10-CM | POA: Diagnosis not present

## 2024-06-12 DIAGNOSIS — Z23 Encounter for immunization: Secondary | ICD-10-CM | POA: Diagnosis not present

## 2024-06-12 DIAGNOSIS — J309 Allergic rhinitis, unspecified: Secondary | ICD-10-CM | POA: Diagnosis not present
